# Patient Record
Sex: Male | Born: 1985 | Race: Black or African American | Hispanic: No | Marital: Single | Smoking: Former smoker
Health system: Southern US, Community
[De-identification: ages and names within clinical notes are randomized; demographics above are authoritative.]

## PROBLEM LIST (undated history)

## (undated) DIAGNOSIS — F419 Anxiety disorder, unspecified: Secondary | ICD-10-CM

## (undated) DIAGNOSIS — I499 Cardiac arrhythmia, unspecified: Secondary | ICD-10-CM

## (undated) DIAGNOSIS — K219 Gastro-esophageal reflux disease without esophagitis: Secondary | ICD-10-CM

---
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
## 2007-01-22 ENCOUNTER — Emergency Department (HOSPITAL_COMMUNITY): Admission: EM | Admit: 2007-01-22 | Discharge: 2007-01-22 | Payer: Self-pay | Admitting: Emergency Medicine

## 2008-03-22 ENCOUNTER — Emergency Department (HOSPITAL_COMMUNITY): Admission: EM | Admit: 2008-03-22 | Discharge: 2008-03-22 | Payer: Self-pay | Admitting: Emergency Medicine

## 2008-04-26 ENCOUNTER — Emergency Department (HOSPITAL_COMMUNITY): Admission: EM | Admit: 2008-04-26 | Discharge: 2008-04-26 | Payer: Self-pay | Admitting: Family Medicine

## 2008-09-07 ENCOUNTER — Emergency Department (HOSPITAL_COMMUNITY): Admission: EM | Admit: 2008-09-07 | Discharge: 2008-09-07 | Payer: Self-pay | Admitting: Family Medicine

## 2009-03-19 ENCOUNTER — Emergency Department (HOSPITAL_COMMUNITY): Admission: EM | Admit: 2009-03-19 | Discharge: 2009-03-19 | Payer: Self-pay | Admitting: Emergency Medicine

## 2010-11-03 LAB — HEPATIC FUNCTION PANEL
ALT: 19 U/L (ref 0–53)
AST: 39 U/L — ABNORMAL HIGH (ref 0–37)
Albumin: 4.6 g/dL (ref 3.5–5.2)
Alkaline Phosphatase: 69 U/L (ref 39–117)
Bilirubin, Direct: 0.3 mg/dL (ref 0.0–0.3)
Indirect Bilirubin: 0.7 mg/dL (ref 0.3–0.9)
Total Bilirubin: 1 mg/dL (ref 0.3–1.2)
Total Protein: 7.8 g/dL (ref 6.0–8.3)

## 2010-11-03 LAB — URINE MICROSCOPIC-ADD ON

## 2010-11-03 LAB — URINALYSIS, ROUTINE W REFLEX MICROSCOPIC
Glucose, UA: NEGATIVE mg/dL
Hgb urine dipstick: NEGATIVE
Ketones, ur: 15 mg/dL — AB
Leukocytes, UA: NEGATIVE
Nitrite: NEGATIVE
Protein, ur: 30 mg/dL — AB
Specific Gravity, Urine: 1.041 — ABNORMAL HIGH (ref 1.005–1.030)
Urobilinogen, UA: 1 mg/dL (ref 0.0–1.0)
pH: 6 (ref 5.0–8.0)

## 2010-11-03 LAB — CBC
HCT: 42.7 % (ref 39.0–52.0)
Hemoglobin: 14.7 g/dL (ref 13.0–17.0)
MCHC: 34.5 g/dL (ref 30.0–36.0)
MCV: 93 fL (ref 78.0–100.0)
Platelets: 78 10*3/uL — ABNORMAL LOW (ref 150–400)
RBC: 4.59 MIL/uL (ref 4.22–5.81)
RDW: 14.4 % (ref 11.5–15.5)
WBC: 7 10*3/uL (ref 4.0–10.5)

## 2010-11-03 LAB — POCT I-STAT, CHEM 8
BUN: 14 mg/dL (ref 6–23)
Calcium, Ion: 1.06 mmol/L — ABNORMAL LOW (ref 1.12–1.32)
Chloride: 106 mEq/L (ref 96–112)
Creatinine, Ser: 1.2 mg/dL (ref 0.4–1.5)
Glucose, Bld: 113 mg/dL — ABNORMAL HIGH (ref 70–99)
HCT: 47 % (ref 39.0–52.0)
Hemoglobin: 16 g/dL (ref 13.0–17.0)
Potassium: 3.4 mEq/L — ABNORMAL LOW (ref 3.5–5.1)
Sodium: 140 mEq/L (ref 135–145)
TCO2: 22 mmol/L (ref 0–100)

## 2010-11-03 LAB — DIFFERENTIAL
Basophils Absolute: 0.1 10*3/uL (ref 0.0–0.1)
Basophils Relative: 1 % (ref 0–1)
Eosinophils Absolute: 0 10*3/uL (ref 0.0–0.7)
Eosinophils Relative: 0 % (ref 0–5)
Lymphocytes Relative: 33 % (ref 12–46)
Lymphs Abs: 2.3 10*3/uL (ref 0.7–4.0)
Monocytes Absolute: 0.7 10*3/uL (ref 0.1–1.0)
Monocytes Relative: 10 % (ref 3–12)
Neutro Abs: 3.9 10*3/uL (ref 1.7–7.7)
Neutrophils Relative %: 56 % (ref 43–77)

## 2010-11-03 LAB — URINE CULTURE
Colony Count: NO GROWTH
Culture: NO GROWTH

## 2010-12-11 NOTE — Assessment & Plan Note (Signed)
[facility]                            CARDIOLOGY OFFICE NOTE   NAME:Brian Anderson                        MRN:          [id number]  DATE:12/31/2006                            DOB:          1985/08/07    CARDIOLOGY CONSULTATION   Brian Anderson is 25 years old.  He has had some chest discomfort.  He has  been seen previously in the emergency room at [facility].  Plans had  been made for him to have a cardiology evaluation.  He did not keep his  appointment at our office back in March.  On October 01, 2006, the patient  was seen in the ER with chest pain.  Hemoglobin was 14.  BUN was 9 and  creatinine 0.9, and troponins were normal.  The patient was allowed to  go home.  He was then seen back in the emergency room on Dec 04, 2006.  It was noted that, from his labs in May of 2005, that his chest x-ray  and T spine films had been normal.  EKG has shown some PACs over time.  He does admit that the pain has made him panicky at times.  During the  evaluation on Dec 04, 2006, he was stable.  It was felt that he had PACs  and that he could follow up with [facility] and with me as an  outpatient, and he is here today for further assessment.   The patient's pain is in his left upper chest.  It is somewhat  positional.  It also can be related to inspiration.  He does not have it  today.  He does not have shortness of breath, nausea or vomiting, or  diaphoresis.   PAST MEDICAL HISTORY:   ALLERGIES:  He develops stomach problems with ASPIRIN.   MEDICATIONS:  He is on no medicines at this time.   OTHER MEDICAL PROBLEMS:  See the list below.   SOCIAL HISTORY:  The patient is working part time at this point.  He  looks to go to school further.  He does not smoke at this time.  He does  not drink.   FAMILY HISTORY:  There is no strong family history of coronary disease.   REVIEW OF SYSTEMS:  He has had some palpitations over time and some  headaches.  He has  also had some mild dizziness.  He has been bothered  by some anxiety and depression historically.  Otherwise, his review of  systems is negative.   PHYSICAL EXAM:  Blood pressure is 124/70 with a pulse of 62.  The patient is oriented to person, time, and place.  His affect is  normal.  His weight is 187 pounds.  There is no xanthelasma.  He has normal extraocular motions.  There are no carotid bruits.  There is no jugular venous distension.  LUNGS:  Clear.  RESPIRATORY:  Not labored.  CARDIAC:  Reveals an S1 with an S2.  There are no clicks or significant  murmurs.  ABDOMEN:  Soft.  He has normal bowel sounds.  He has no  peripheral edema.   EKG:  Reveals benign early repolarization.   IMPRESSION:  1. Chest discomfort.  This appears to have a musculoskeletal component      to it.  We know that his chest x-ray and spine films are reported      as normal through the emergency room note.  I doubt that the      patient has significant cardiac ischemia.  I do feel it would be      helpful to do a standard exercise tolerance test, and this will be      arranged.  2. History of some anxiety and depression.  3. Some gastrointestinal symptoms from ASPIRIN in the past.     Luis Abed, MD, [facility]  Electronically Signed    JDK/MedQ  DD: 12/31/2006  DT: 12/31/2006  Job #: [id number]   cc:   [facility]

## 2011-05-15 LAB — H. PYLORI ANTIBODY, IGG: H Pylori IgG: 5.3 — ABNORMAL HIGH

## 2011-05-15 LAB — POCT URINALYSIS DIP (DEVICE)
Bilirubin Urine: NEGATIVE
Glucose, UA: NEGATIVE
Hgb urine dipstick: NEGATIVE
Nitrite: NEGATIVE
Operator id: 247071
Specific Gravity, Urine: 1.02
Urobilinogen, UA: 1
pH: 6.5

## 2011-07-30 HISTORY — PX: OTHER SURGICAL HISTORY: SHX169

## 2013-07-05 ENCOUNTER — Encounter (HOSPITAL_COMMUNITY): Payer: Self-pay | Admitting: Emergency Medicine

## 2013-07-05 ENCOUNTER — Emergency Department (INDEPENDENT_AMBULATORY_CARE_PROVIDER_SITE_OTHER)
Admission: EM | Admit: 2013-07-05 | Discharge: 2013-07-05 | Disposition: A | Discharge status: Home / Self Care | Payer: Worker's Compensation | Source: Home / Self Care | Attending: Family Medicine | Admitting: Family Medicine

## 2013-07-05 DIAGNOSIS — M65839 Other synovitis and tenosynovitis, unspecified forearm: Secondary | ICD-10-CM

## 2013-07-05 DIAGNOSIS — M778 Other enthesopathies, not elsewhere classified: Secondary | ICD-10-CM

## 2013-07-05 NOTE — ED Notes (Signed)
reportedly has been having pain both wrists, forearms for past few weeks; no known injury; hands go numb, looses grip w hands

## 2013-07-05 NOTE — ED Provider Notes (Signed)
CSN: [id number]     Arrival date & time 07/05/13  1640 History   None    Chief Complaint  Patient presents with  . Wrist Pain   (Consider location/radiation/quality/duration/timing/severity/associated sxs/prior Treatment) HPI Comments: 89m with right wrist pain for 3 weeks, started mild, now worse and constant.  Has had some subjective numbness in hand, intermittent, resolves quickly.  Had some swelling, this has mostly resolved.  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  No injury.  Pain worse throughout the day and worse at night.  No issues with left wrist.  No Hx of similar pains    History reviewed. No pertinent past medical history. History reviewed. No pertinent past surgical history. History reviewed. No pertinent family history. History  Substance Use Topics  . Smoking status: Current Every Day Smoker  . Smokeless tobacco: Not on file  . Alcohol Use: Yes    Review of Systems  Constitutional: Negative for fever, chills and fatigue.  HENT: Negative for sore throat.   Eyes: Negative for visual disturbance.  Respiratory: Negative for cough and shortness of breath.   Cardiovascular: Negative for chest pain, palpitations and leg swelling.  Gastrointestinal: Negative for nausea, vomiting, abdominal pain, diarrhea and constipation.  Genitourinary: Negative for dysuria, urgency, frequency and hematuria.  Musculoskeletal: Positive for arthralgias (right wrist pain). Negative for myalgias, neck pain and neck stiffness.  Skin: Negative for rash.  Neurological: Negative for dizziness, weakness and light-headedness.    Allergies  Review of patient's allergies indicates not on file.  Home Medications  No current outpatient prescriptions on file. BP 136/72  Pulse 68  Temp(Src) 97.8 F (36.6 C) (Oral)  Resp 18  SpO2 98% Physical Exam  Nursing note and vitals reviewed. Constitutional: He is oriented to person,  place, and time. He appears well-developed and well-nourished. No distress.  HENT:  Head: Normocephalic.  Pulmonary/Chest: Effort normal. No respiratory distress.  Musculoskeletal:       Right wrist: He exhibits decreased range of motion (decreased flexion and extension, splinting secondary to pain in wrist; decreased grip strenth as well), tenderness (dorsal and volar wrist) and swelling (mild). He exhibits no bony tenderness, no effusion, no crepitus and no deformity.  Neurological: He is alert and oriented to person, place, and time. No sensory deficit. Coordination normal.  Skin: Skin is warm and dry. No rash noted. He is not diaphoretic.  Psychiatric: He has a normal mood and affect. Judgment normal.    ED Course  Procedures (including critical care time) Labs Review Labs Reviewed - No data to display Imaging Review No results found.    MDM   1. Tendinitis of right wrist    Consistent with tendinitis.  Splint, 2 aleve twice daily (has at home), ice preferably 3-4 times daily but at least twice.  Use splint at work and while sleeping.  Dont do things that cause pain in wrist.  Referral info given for Dr. Eulah Pont, ortho, if not improving.         Graylon Good, PA-C 07/05/13 1759

## 2013-07-05 NOTE — ED Provider Notes (Signed)
Medical screening examination/treatment/procedure(s) were performed by resident physician or non-physician practitioner and as supervising physician I was immediately available for consultation/collaboration.   Barkley Bruns MD.   Linna Hoff, MD 07/05/13 2038

## 2013-09-09 ENCOUNTER — Emergency Department (HOSPITAL_COMMUNITY): Payer: BC Managed Care – PPO

## 2013-09-09 ENCOUNTER — Emergency Department (HOSPITAL_COMMUNITY)
Admission: EM | Admit: 2013-09-09 | Discharge: 2013-09-10 | Disposition: A | Discharge status: Home / Self Care | Payer: BC Managed Care – PPO | Attending: Emergency Medicine | Admitting: Emergency Medicine

## 2013-09-09 DIAGNOSIS — Y9389 Activity, other specified: Secondary | ICD-10-CM | POA: Insufficient documentation

## 2013-09-09 DIAGNOSIS — S62639A Displaced fracture of distal phalanx of unspecified finger, initial encounter for closed fracture: Secondary | ICD-10-CM | POA: Insufficient documentation

## 2013-09-09 DIAGNOSIS — Y92009 Unspecified place in unspecified non-institutional (private) residence as the place of occurrence of the external cause: Secondary | ICD-10-CM | POA: Insufficient documentation
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
## 2013-09-10 MED ORDER — HYDROCODONE-ACETAMINOPHEN 5-325 MG PO TABS: 1.0000 | ORAL_TABLET | ORAL | Status: DC | PRN

## 2013-09-10 NOTE — Discharge Instructions (Signed)
Followup with [facility] or Dr. Eulah PontMurphy soon as possible. Take Vicodin for severe pain only. No driving or operating heavy machinery while taking vicodin. This medication may cause drowsiness.  Finger Fracture Fractures of fingers are breaks in the bones of the fingers. There are many types of fractures. There are different ways of treating these fractures. Your health care provider will discuss the best way to treat your fracture. CAUSES Traumatic injury is the main cause of broken fingers. These include:  Injuries while playing sports.  Workplace injuries.  Falls. RISK FACTORS Activities that can increase your risk of finger fractures include:  Sports.  Workplace activities that involve machinery.  A condition called osteoporosis, which can make your bones less dense and cause them to fracture more easily. SIGNS AND SYMPTOMS The main symptoms of a broken finger are pain and swelling within 15 minutes after the injury. Other symptoms include:  Bruising of your finger.  Stiffness of your finger.  Numbness of your finger.  Exposed bones (compound fracture) if the fracture is severe. DIAGNOSIS  The best way to diagnose a broken bone is with X-ray imaging. Additionally, your health care provider will use this X-ray image to evaluate the position of the broken finger bones.  TREATMENT  Finger fractures can be treated with:   Nonreduction This means the bones are in place. The finger is splinted without changing the positions of the bone pieces. The splint is usually left on for about a week to 10 days. This will depend on your fracture and what your health care provider thinks.  Closed reduction The bones are put back into position without using surgery. The finger is then splinted.  Open reduction and internal fixation The fracture site is opened. Then the bone pieces are fixed into place with pins or some type of hardware. This is seldom required. It depends on the  severity of the fracture. HOME CARE INSTRUCTIONS   Follow your health care provider's instructions regarding activities, exercises, and physical therapy.  Only take over-the-counter or prescription medicines for pain, discomfort, or fever as directed by your health care provider. SEEK MEDICAL CARE IF: You have pain or swelling that limits the motion or use of your fingers. SEEK IMMEDIATE MEDICAL CARE IF:  Your finger becomes numb. MAKE SURE YOU:   Understand these instructions.  Will watch your condition.  Will get help right away if you are not doing well or get worse. Document Released: 10/27/2000 Document Revised: 05/05/2013 Document Reviewed: 02/24/2013 [facility] Patient Information 2014 [employer] [place], [place].

## 2013-09-11 NOTE — ED Provider Notes (Signed)
Medical screening examination/treatment/procedure(s) were performed by non-physician practitioner and as supervising physician I was immediately available for consultation/collaboration.  EKG Interpretation   None        Derwood KaplanAnkit Conner Muegge, MD 09/11/13 [phone]

## 2013-09-21 ENCOUNTER — Emergency Department (INDEPENDENT_AMBULATORY_CARE_PROVIDER_SITE_OTHER)
Admission: EM | Admit: 2013-09-21 | Discharge: 2013-09-21 | Disposition: A | Discharge status: Home / Self Care | Payer: BC Managed Care – PPO | Source: Home / Self Care

## 2013-09-21 ENCOUNTER — Encounter (HOSPITAL_COMMUNITY): Payer: Self-pay | Admitting: Emergency Medicine

## 2013-09-21 DIAGNOSIS — R42 Dizziness and giddiness: Secondary | ICD-10-CM

## 2013-09-21 MED ORDER — MECLIZINE HCL 25 MG PO TABS: 25.0000 mg | ORAL_TABLET | Freq: Three times a day (TID) | ORAL | Status: DC | PRN

## 2013-09-21 NOTE — ED Notes (Signed)
Reports having dizziness since 10 a.m.  States it usually comes and goes but states this time dizziness has been constant.    Mild nausea.   Denies any other symptoms.

## 2013-09-21 NOTE — ED Provider Notes (Signed)
CSN: [id number]     Arrival date & time 09/21/13  1517 History   First MD Initiated Contact with Patient 09/21/13 1555     Chief Complaint  Patient presents with  . Dizziness     (Consider location/radiation/quality/duration/timing/severity/associated sxs/prior Treatment) HPI Comments: 28 year old male is complaining of dizziness that began this morning it 10:00. Occasionally has dizziness at work and usually subsides spontaneously. He is concerned that this occurred while he was not working and is lasted the remainder of the stay. It is worse when standing and better when lying down. He was able to sleep a couple hours this afternoon and felt generally well. Sometimes body position changes and movement of the head will cause dizziness. Denies vertigo, nausea or vomiting. Denies problems with vision, speech, hearing, swallowing, focal weakness or paresthesias. No incontinence. No headache. No history of trauma.   History reviewed. No pertinent past medical history. History reviewed. No pertinent past surgical history. History reviewed. No pertinent family history. History  Substance Use Topics  . Smoking status: Current Every Day Smoker  . Smokeless tobacco: Not on file  . Alcohol Use: Yes    Review of Systems  Constitutional: Negative.   HENT: Negative.   Eyes: Negative.   Respiratory: Negative.   Cardiovascular: Negative.   Gastrointestinal: Negative.   Genitourinary: Negative.   Musculoskeletal: Negative.   Skin: Negative.   Neurological: Positive for dizziness. Negative for tremors, seizures, syncope, facial asymmetry, speech difficulty, weakness, numbness and headaches.  Psychiatric/Behavioral: Negative for behavioral problems, confusion, sleep disturbance and agitation. The patient is not nervous/anxious.       Allergies  Review of patient's allergies indicates no known allergies.  Home Medications   Current Outpatient Rx  Name  Route  Sig  Dispense  Refill  .  HYDROcodone-acetaminophen (NORCO/VICODIN) 5-325 MG per tablet   Oral   Take 1-2 tablets by mouth every 4 (four) hours as needed.   10 tablet   0   . meclizine (ANTIVERT) 25 MG tablet   Oral   Take 1 tablet (25 mg total) by mouth 3 (three) times daily as needed for dizziness.   15 tablet   0    BP 144/98  Pulse 67  Temp(Src) 98.8 F (37.1 C) (Oral)  Resp 16  SpO2 99% Physical Exam  Nursing note and vitals reviewed. Constitutional: He is oriented to person, place, and time. He appears well-developed and well-nourished. No distress.  HENT:  Head: Normocephalic and atraumatic.  Mouth/Throat: Oropharynx is clear and moist. No oropharyngeal exudate.  Bilateral TMs are normal  Eyes: Conjunctivae and EOM are normal. Pupils are equal, round, and reactive to light.  Neck: Normal range of motion. Neck supple.  Cardiovascular: Normal rate, regular rhythm and normal heart sounds.   Pulmonary/Chest: Effort normal and breath sounds normal. No respiratory distress. He has no wheezes.  Musculoskeletal: Normal range of motion. He exhibits no edema and no tenderness.  Lymphadenopathy:    He has no cervical adenopathy.  Neurological: He is alert and oriented to person, place, and time. He has normal strength. He displays no tremor. No cranial nerve deficit or sensory deficit. He exhibits normal muscle tone. He displays a negative Romberg sign. He displays no seizure activity. Coordination and gait normal.  Experiences some sway and dizziness with Romberg test. Heel-to-toe is balanced.  Skin: Skin is warm and dry. No rash noted.  Psychiatric: He has a normal mood and affect.    ED Course  Procedures (including critical care time)  Labs Review Labs Reviewed - No data to display Imaging Review No results found.    MDM   Final diagnoses:  Dizziness    Reassurance. Most likely inner ear disorder or side effect of the hydrocodone. The patient is in generally good health and not  demonstrating other symptoms. Meclizine 25 mg take one half to one tablet 3 times a day when necessary. drink plenty fluids and stay well hydrated Worsening problems or need symptoms get rechecked. Return or if necessary get to the emergency department.    Hayden Rasmussenavid Zakhai Meisinger, NP 09/21/13 [phone]

## 2013-09-21 NOTE — Discharge Instructions (Signed)

## 2013-09-22 NOTE — ED Provider Notes (Signed)
Medical screening examination/treatment/procedure(s) were performed by a resident physician or non-physician practitioner and as the supervising physician I was immediately available for consultation/collaboration.  Evan Corey, MD    Evan S Corey, MD 09/22/13 0748 

## 2013-09-30 ENCOUNTER — Encounter (HOSPITAL_COMMUNITY): Payer: Self-pay | Admitting: Pharmacy Technician

## 2013-09-30 ENCOUNTER — Other Ambulatory Visit (HOSPITAL_COMMUNITY): Payer: Self-pay | Admitting: Orthopaedic Surgery

## 2013-10-07 MED ORDER — CEFAZOLIN SODIUM-DEXTROSE 2-3 GM-% IV SOLR
2.0000 g | INTRAVENOUS | Status: AC
  Administered 2013-10-08: 2 g via INTRAVENOUS
  Filled 2013-10-07: qty 50

## 2013-10-08 ENCOUNTER — Ambulatory Visit (HOSPITAL_COMMUNITY)
Admission: RE | Admit: 2013-10-08 | Discharge: 2013-10-08 | Disposition: A | Discharge status: Home / Self Care | Payer: BC Managed Care – PPO | Source: Ambulatory Visit | Attending: Orthopaedic Surgery | Admitting: Orthopaedic Surgery

## 2013-10-08 ENCOUNTER — Ambulatory Visit (HOSPITAL_COMMUNITY): Payer: BC Managed Care – PPO | Admitting: Anesthesiology

## 2013-10-08 ENCOUNTER — Encounter (HOSPITAL_COMMUNITY): Payer: BC Managed Care – PPO | Admitting: Anesthesiology

## 2013-10-08 ENCOUNTER — Encounter (HOSPITAL_COMMUNITY)
Admission: RE | Disposition: A | Discharge status: Home / Self Care | Payer: Self-pay | Source: Ambulatory Visit | Attending: Orthopaedic Surgery

## 2013-10-08 ENCOUNTER — Encounter (HOSPITAL_COMMUNITY): Payer: Self-pay | Admitting: *Deleted

## 2013-10-08 DIAGNOSIS — F411 Generalized anxiety disorder: Secondary | ICD-10-CM | POA: Insufficient documentation

## 2013-10-08 DIAGNOSIS — I499 Cardiac arrhythmia, unspecified: Secondary | ICD-10-CM | POA: Insufficient documentation

## 2013-10-08 DIAGNOSIS — F17200 Nicotine dependence, unspecified, uncomplicated: Secondary | ICD-10-CM | POA: Insufficient documentation

## 2013-10-08 DIAGNOSIS — S62639A Displaced fracture of distal phalanx of unspecified finger, initial encounter for closed fracture: Secondary | ICD-10-CM | POA: Insufficient documentation

## 2013-10-08 DIAGNOSIS — K219 Gastro-esophageal reflux disease without esophagitis: Secondary | ICD-10-CM | POA: Insufficient documentation

## 2013-10-08 DIAGNOSIS — X58XXXA Exposure to other specified factors, initial encounter: Secondary | ICD-10-CM | POA: Insufficient documentation

## 2013-10-08 HISTORY — PX: PERCUTANEOUS PINNING: SHX2209

## 2013-10-08 HISTORY — DX: Anxiety disorder, unspecified: F41.9

## 2013-10-08 HISTORY — DX: Gastro-esophageal reflux disease without esophagitis: K21.9

## 2013-10-08 HISTORY — DX: Cardiac arrhythmia, unspecified: I49.9

## 2013-10-08 LAB — BASIC METABOLIC PANEL
BUN: 9 mg/dL (ref 6–23)
CALCIUM: 10 mg/dL (ref 8.4–10.5)
CO2: 27 meq/L (ref 19–32)
Chloride: 99 mEq/L (ref 96–112)
Creatinine, Ser: 0.99 mg/dL (ref 0.50–1.35)
GFR calc Af Amer: >90 mL/min (ref >90)
Glucose, Bld: 89 mg/dL (ref 70–99)
POTASSIUM: 4.3 meq/L (ref 3.7–5.3)
Sodium: 140 mEq/L (ref 137–147)

## 2013-10-08 LAB — CBC
HCT: 43.9 % (ref 39.0–52.0)
HEMOGLOBIN: 14.9 g/dL (ref 13.0–17.0)
MCH: 29.6 pg (ref 26.0–34.0)
MCHC: 33.9 g/dL (ref 30.0–36.0)
MCV: 87.3 fL (ref 78.0–100.0)
Platelets: 153 10*3/uL (ref 150–400)
RBC: 5.03 MIL/uL (ref 4.22–5.81)
RDW: 13.6 % (ref 11.5–15.5)
WBC: 7.4 10*3/uL (ref 4.0–10.5)

## 2013-10-08 SURGERY — PINNING, EXTREMITY, PERCUTANEOUS
Anesthesia: General | Site: Hand | Laterality: Left

## 2013-10-08 MED ORDER — 0.9 % SODIUM CHLORIDE (POUR BTL) OPTIME
TOPICAL | Status: DC | PRN
  Administered 2013-10-08: 1000 mL

## 2013-10-08 MED ORDER — ONDANSETRON HCL 4 MG/2ML IJ SOLN: 4.0000 mg | Freq: Once | INTRAMUSCULAR | Status: DC | PRN

## 2013-10-08 MED ORDER — HYDROMORPHONE HCL PF 1 MG/ML IJ SOLN
0.2500 mg | INTRAMUSCULAR | Status: DC | PRN
  Administered 2013-10-08 (×3): 0.5 mg via INTRAVENOUS

## 2013-10-08 MED ORDER — SUCCINYLCHOLINE CHLORIDE 20 MG/ML IJ SOLN
INTRAMUSCULAR | Status: AC
  Filled 2013-10-08: qty 1

## 2013-10-08 MED ORDER — MIDAZOLAM HCL 2 MG/2ML IJ SOLN
INTRAMUSCULAR | Status: AC
  Filled 2013-10-08: qty 2

## 2013-10-08 MED ORDER — PROPOFOL 10 MG/ML IV BOLUS
INTRAVENOUS | Status: AC
  Filled 2013-10-08: qty 20

## 2013-10-08 MED ORDER — HYDROMORPHONE HCL PF 1 MG/ML IJ SOLN
INTRAMUSCULAR | Status: AC
  Administered 2013-10-08: 0.5 mg via INTRAVENOUS
  Filled 2013-10-08: qty 1

## 2013-10-08 MED ORDER — LACTATED RINGERS IV SOLN
INTRAVENOUS | Status: DC
  Administered 2013-10-08: 13:00:00 via INTRAVENOUS

## 2013-10-08 MED ORDER — ONDANSETRON HCL 4 MG/2ML IJ SOLN
INTRAMUSCULAR | Status: DC | PRN
  Administered 2013-10-08: 4 mg via INTRAVENOUS

## 2013-10-08 MED ORDER — LIDOCAINE HCL (CARDIAC) 20 MG/ML IV SOLN
INTRAVENOUS | Status: AC
  Filled 2013-10-08: qty 5

## 2013-10-08 MED ORDER — HYDROCODONE-ACETAMINOPHEN 5-325 MG PO TABS
1.0000 | ORAL_TABLET | Freq: Once | ORAL | Status: AC
  Administered 2013-10-08: 2 via ORAL

## 2013-10-08 MED ORDER — OXYCODONE HCL 5 MG/5ML PO SOLN: 5.0000 mg | Freq: Once | ORAL | Status: DC | PRN

## 2013-10-08 MED ORDER — HYDROCODONE-ACETAMINOPHEN 5-325 MG PO TABS
ORAL_TABLET | ORAL | Status: AC
  Filled 2013-10-08: qty 2

## 2013-10-08 MED ORDER — FENTANYL CITRATE 0.05 MG/ML IJ SOLN
INTRAMUSCULAR | Status: DC | PRN
  Administered 2013-10-08: 50 ug via INTRAVENOUS
  Administered 2013-10-08 (×4): 25 ug via INTRAVENOUS
  Administered 2013-10-08: 50 ug via INTRAVENOUS

## 2013-10-08 MED ORDER — HYDROCODONE-ACETAMINOPHEN 5-325 MG PO TABS: 1.0000 | ORAL_TABLET | Freq: Four times a day (QID) | ORAL | Status: DC | PRN

## 2013-10-08 MED ORDER — FENTANYL CITRATE 0.05 MG/ML IJ SOLN
INTRAMUSCULAR | Status: AC
  Filled 2013-10-08: qty 5

## 2013-10-08 MED ORDER — LIDOCAINE HCL (CARDIAC) 10 MG/ML IV SOLN
INTRAVENOUS | Status: DC | PRN
  Administered 2013-10-08: 50 mg via INTRAVENOUS

## 2013-10-08 MED ORDER — OXYCODONE HCL 5 MG PO TABS: 5.0000 mg | ORAL_TABLET | Freq: Once | ORAL | Status: DC | PRN

## 2013-10-08 MED ORDER — PROPOFOL 10 MG/ML IV BOLUS
INTRAVENOUS | Status: DC | PRN
  Administered 2013-10-08: 200 mg via INTRAVENOUS

## 2013-10-08 MED ORDER — MIDAZOLAM HCL 5 MG/5ML IJ SOLN
INTRAMUSCULAR | Status: DC | PRN
  Administered 2013-10-08: 2 mg via INTRAVENOUS

## 2013-10-08 SURGICAL SUPPLY — 38 items
APL SKNCLS STERI-STRIP NONHPOA (GAUZE/BANDAGES/DRESSINGS)
BANDAGE COBAN STERILE 2 (GAUZE/BANDAGES/DRESSINGS) ×2 IMPLANT
BANDAGE CONFORM 2  STR LF (GAUZE/BANDAGES/DRESSINGS) ×2 IMPLANT
BANDAGE ELASTIC 3 VELCRO ST LF (GAUZE/BANDAGES/DRESSINGS) ×3 IMPLANT
BANDAGE ELASTIC 4 VELCRO ST LF (GAUZE/BANDAGES/DRESSINGS) IMPLANT
BANDAGE GAUZE ELAST BULKY 4 IN (GAUZE/BANDAGES/DRESSINGS) ×3 IMPLANT
BENZOIN TINCTURE PRP APPL 2/3 (GAUZE/BANDAGES/DRESSINGS) IMPLANT
BLADE SURG ROTATE 9660 (MISCELLANEOUS) IMPLANT
CLOSURE WOUND 1/2 X4 (GAUZE/BANDAGES/DRESSINGS)
COVER SURGICAL LIGHT HANDLE (MISCELLANEOUS) ×3 IMPLANT
CUFF TOURNIQUET SINGLE 18IN (TOURNIQUET CUFF) IMPLANT
CUFF TOURNIQUET SINGLE 24IN (TOURNIQUET CUFF) IMPLANT
DURAPREP 26ML APPLICATOR (WOUND CARE) ×3 IMPLANT
ELECT CAUTERY BLADE 6.4 (BLADE) ×3 IMPLANT
FACESHIELD LNG OPTICON STERILE (SAFETY) IMPLANT
GAUZE XEROFORM 1X8 LF (GAUZE/BANDAGES/DRESSINGS) ×2 IMPLANT
GLOVE SS BIOGEL STRL SZ 7.5 (GLOVE) ×2 IMPLANT
GLOVE SUPERSENSE BIOGEL SZ 7.5 (GLOVE) ×4
GOWN STRL REUS W/ TWL LRG LVL3 (GOWN DISPOSABLE) ×2 IMPLANT
GOWN STRL REUS W/ TWL XL LVL3 (GOWN DISPOSABLE) ×2 IMPLANT
GOWN STRL REUS W/TWL LRG LVL3 (GOWN DISPOSABLE) ×6
GOWN STRL REUS W/TWL XL LVL3 (GOWN DISPOSABLE) ×6
K-WIRE SMTH SNGL TROCAR .035X9 ×6 IMPLANT
KIT BASIN OR (CUSTOM PROCEDURE TRAY) ×3 IMPLANT
KIT ROOM TURNOVER OR (KITS) ×3 IMPLANT
KWIRE SMTH SNGL TROCAR .035X9 IMPLANT
NS IRRIG 1000ML POUR BTL (IV SOLUTION) ×3 IMPLANT
PACK ORTHO EXTREMITY (CUSTOM PROCEDURE TRAY) ×3 IMPLANT
PAD ARMBOARD 7.5X6 YLW CONV (MISCELLANEOUS) ×6 IMPLANT
SPLINT FINGER (SOFTGOODS) ×2 IMPLANT
SPONGE GAUZE 4X4 12PLY (GAUZE/BANDAGES/DRESSINGS) IMPLANT
STRIP CLOSURE SKIN 1/2X4 (GAUZE/BANDAGES/DRESSINGS) IMPLANT
SUT ETHILON 4 0 P 3 18 (SUTURE) IMPLANT
SUT PROLENE 4 0 P 3 18 (SUTURE) IMPLANT
TOWEL OR 17X24 6PK STRL BLUE (TOWEL DISPOSABLE) ×3 IMPLANT
TOWEL OR 17X26 10 PK STRL BLUE (TOWEL DISPOSABLE) ×3 IMPLANT
UNDERPAD 30X30 INCONTINENT (UNDERPADS AND DIAPERS) ×3 IMPLANT
WATER STERILE IRR 1000ML POUR (IV SOLUTION) ×3 IMPLANT

## 2013-10-08 NOTE — H&P (Signed)
PREOPERATIVE H&P  Chief Complaint: LEFT DISTAL PHALANX FRACTURE 5TH FINGER   HPI: Brian Anderson is a 28 y.o. male who presents for surgical treatment of LEFT DISTAL PHALANX FRACTURE 5TH FINGER .  He denies any changes in medical history.  Past Medical History  Diagnosis Date  . GERD (gastroesophageal reflux disease)   . Anxiety   . Dysrhythmia     pt reports irreg heart beat "PACS" per pt    Past Surgical History  Procedure Laterality Date  . Wisdom teeth  2013   History   Social History  . Marital Status: Single    Spouse Name: N/A    Number of Children: N/A  . Years of Education: N/A   Social History Main Topics  . Smoking status: Current Every Day Smoker -- 1.00 packs/day for 4 years    Types: Cigars  . Smokeless tobacco: None  . Alcohol Use: 5.4 oz/week    3 Cans of beer, 6 Shots of liquor per week  . Drug Use: No  . Sexual Activity: Yes   Other Topics Concern  . None   Social History Narrative  . None   History reviewed. No pertinent family history. No Known Allergies Prior to Admission medications   Medication Sig Start Date End Date Taking? Authorizing Provider  HYDROcodone-acetaminophen (NORCO/VICODIN) 5-325 MG per tablet Take 1-2 tablets by mouth every 4 (four) hours as needed for moderate pain.   Yes Historical Provider, MD  meclizine (ANTIVERT) 25 MG tablet Take 1 tablet (25 mg total) by mouth 3 (three) times daily as needed for dizziness. 09/21/13  Yes Hayden Rasmussenavid Mabe, NP     Positive ROS: All other systems have been reviewed and were otherwise negative with the exception of those mentioned in the HPI and as above.  Physical Exam: General: Alert, no acute distress Cardiovascular: No pedal edema Respiratory: No cyanosis, no use of accessory musculature GI: No organomegaly, abdomen is soft and non-tender Skin: No lesions in the area of chief complaint Neurologic: Sensation intact distally Psychiatric: Patient is competent for consent with normal mood and  affect Lymphatic: No axillary or cervical lymphadenopathy  MUSCULOSKELETAL:  - no skin issues in area of planned surgery  Assessment: LEFT DISTAL PHALANX FRACTURE 5TH FINGER   Plan: Plan for Procedure(s): LEFT 5TH FINGER CLOSED REDUCTION PERCUTANEOUS PINNING, POSSIBLE OPEN  The risks benefits and alternatives were discussed with the patient including but not limited to the risks of nonoperative treatment, versus surgical intervention including infection, bleeding, nerve injury,  blood clots, cardiopulmonary complications, morbidity, mortality, among others, and they were willing to proceed.   Cheral AlmasXu, Naiping Michael, MD   10/08/2013 1:06 PM '

## 2013-10-08 NOTE — Transfer of Care (Signed)
Immediate Anesthesia Transfer of Care Note  Patient: Brian MoralesBilly D Thies  Procedure(s) Performed: Procedure(s): LEFT 5TH FINGER CLOSED REDUCTION PERCUTANEOUS PINNING (Left)  Patient Location: PACU  Anesthesia Type:General  Level of Consciousness: awake, alert  and oriented  Airway & Oxygen Therapy: Patient Spontanous Breathing and Patient connected to nasal cannula oxygen  Post-op Assessment: Report given to PACU RN, Post -op Vital signs reviewed and stable and Patient moving all extremities X 4  Post vital signs: Reviewed and stable  Complications: No apparent anesthesia complications

## 2013-10-08 NOTE — Progress Notes (Signed)
10/08/13 1238  OBSTRUCTIVE SLEEP APNEA  Have you ever been diagnosed with sleep apnea through a sleep study? No  Do you snore loudly (loud enough to be heard through closed doors)?  1  Do you often feel tired, fatigued, or sleepy during the daytime? 1  Has anyone observed you stop breathing during your sleep? 0  Do you have, or are you being treated for high blood pressure? 1  BMI more than 35 kg/m2? 0  Age over 26 years old? 0  Neck circumference greater than 40 cm/18 inches? 0  Gender: 1  Obstructive Sleep Apnea Score 4  Score 4 or greater  Results sent to PCP

## 2013-10-08 NOTE — Op Note (Signed)
Date of surgery: 10/08/2000  Preoperative diagnosis: Failed closed treatment of distal phalanx fracture of left fifth finger  Postoperative diagnosis: Same  Procedure: Closed reduction and percutaneous pinning of distal phalanx fracture of left fifth finger  Surgeon: Glee ArvinMichael Easter Schinke, M.D. next  Anesthesia: Gen.  Estimated blood loss: Minimal  Complications: None  Indications for procedure: Brian Anderson is a 28 year old gentleman who sustained a distal phalanx fracture we attempted closed treatment with by splinting. Splinting revealed to be inadequate for maintain reduction of the phalangeal fracture. The the recommendation at that time was to perform the above mentioned procedure. The risks, benefits, and alternatives to surgery were discussed with the patient. The risks included but not limited to infection nonunion continued pain additional surgeries and anesthesia risks including heart attack stroke and death.  Description of procedure: The patient was identified in preoperative holding area. The operative site was confirmed the patient and marked by the surgeon. He was brought back to the operating room. His placed supine on the table. General anesthesia was induced by the anesthesiologist. A nonsterile tourniquet was placed on the upper left arm. The left upper extremity was prepped and draped in standard sterile fashion. A timeout was performed. Preoperative antibiotics were given. Closed reduction was performed on the distal phalanx. Adequate alignment and reduction of the fracture was confirmed on x-rays. One 0.035 inch K wire was advanced through the distal phalanx across the fracture site into the base of the distal phalanx and into the middle phalanx. The decision to capture the middle phalanx was made given the fact that the proximal portion of the distal phalanx was such a small piece that the pin was not able to get much purchase and I was only able to use one pin given the size of the  distal phalanx. Once this was done x-rays were taken to confirm adequate reduction and pin placement a sterile dressing was applied the finger was placed in a AlumaFoam splint. The patient awoke from anesthesia uneventfully and was transferred to the PACU in stable condition.  Disposition: The patient will be nonweightbearing to the left hand. He will keep the dressing on until his followup appointment in 2 weeks.  Mayra ReelN. Michael Kia Stavros, MD [facility] [phone] 2:57 PM

## 2013-10-08 NOTE — Anesthesia Procedure Notes (Signed)
Procedure Name: LMA Insertion Date/Time: 10/08/2013 2:16 PM Performed by: Elon AlasLEE, Aby Gessel BROWN Pre-anesthesia Checklist: Patient identified, Timeout performed, Emergency Drugs available, Suction available and Patient being monitored Patient Re-evaluated:Patient Re-evaluated prior to inductionOxygen Delivery Method: Circle system utilized Preoxygenation: Pre-oxygenation with 100% oxygen Intubation Type: IV induction Ventilation: Mask ventilation without difficulty LMA: LMA inserted LMA Size: 5.0 Number of attempts: 1 Placement Confirmation: positive ETCO2,  CO2 detector and breath sounds checked- equal and bilateral Tube secured with: Tape Dental Injury: Teeth and Oropharynx as per pre-operative assessment

## 2013-10-08 NOTE — Anesthesia Preprocedure Evaluation (Addendum)
Anesthesia Evaluation  Patient identified by MRN, date of birth, ID band Patient awake    Reviewed: Allergy & Precautions, H&P , NPO status , Patient's Chart, lab work & pertinent test results, reviewed documented beta blocker date and time   Airway Mallampati: II      Dental  (+) Teeth Intact, Dental Advisory Given   Pulmonary Current Smoker,  breath sounds clear to auscultation        Cardiovascular Rhythm:Regular Rate:Normal     Neuro/Psych    GI/Hepatic   Endo/Other    Renal/GU      Musculoskeletal   Abdominal (+) + obese,   Peds  Hematology   Anesthesia Other Findings   Reproductive/Obstetrics                          Anesthesia Physical Anesthesia Plan  ASA: II  Anesthesia Plan: General   Post-op Pain Management:    Induction: Intravenous  Airway Management Planned: LMA  Additional Equipment:   Intra-op Plan:   Post-operative Plan:   Informed Consent: I have reviewed the patients History and Physical, chart, labs and discussed the procedure including the risks, benefits and alternatives for the proposed anesthesia with the patient or authorized representative who has indicated his/her understanding and acceptance.   Dental advisory given  Plan Discussed with: CRNA and Anesthesiologist  Anesthesia Plan Comments: (L. Finger Fracture with non-union Smoker  Plan GA with LMA  Kipp Broodavid Suheyb Raucci, MD )        Anesthesia Quick Evaluation

## 2013-10-08 NOTE — Discharge Instructions (Signed)
Keep splint on until follow up appointment.

## 2013-10-08 NOTE — Anesthesia Postprocedure Evaluation (Signed)
  Anesthesia Post-op Note  Patient: Brian Anderson  Procedure(s) Performed: Procedure(s): LEFT 5TH FINGER CLOSED REDUCTION PERCUTANEOUS PINNING (Left)  Patient Location: PACU  Anesthesia Type:General  Level of Consciousness: awake, alert  and oriented  Airway and Oxygen Therapy: Patient Spontanous Breathing and Patient connected to nasal cannula oxygen  Post-op Pain: mild  Post-op Assessment: Post-op Vital signs reviewed, Patient's Cardiovascular Status Stable, Respiratory Function Stable, Patent Airway and Pain level controlled  Post-op Vital Signs: stable  Complications: No apparent anesthesia complications

## 2013-10-08 NOTE — Preoperative (Signed)
Beta Blockers   Reason not to administer Beta Blockers:Not Applicable 

## 2013-10-12 ENCOUNTER — Encounter (HOSPITAL_COMMUNITY): Payer: Self-pay | Admitting: Orthopaedic Surgery

## 2013-11-13 ENCOUNTER — Emergency Department (HOSPITAL_COMMUNITY)
Admission: EM | Admit: 2013-11-13 | Discharge: 2013-11-13 | Disposition: A | Discharge status: Home / Self Care | Payer: BC Managed Care – PPO | Attending: Emergency Medicine | Admitting: Emergency Medicine

## 2013-11-13 ENCOUNTER — Encounter (HOSPITAL_COMMUNITY): Payer: Self-pay | Admitting: Emergency Medicine

## 2013-11-13 DIAGNOSIS — M79609 Pain in unspecified limb: Secondary | ICD-10-CM | POA: Insufficient documentation

## 2013-11-13 DIAGNOSIS — F411 Generalized anxiety disorder: Secondary | ICD-10-CM | POA: Insufficient documentation

## 2013-11-13 DIAGNOSIS — M79661 Pain in right lower leg: Secondary | ICD-10-CM

## 2013-11-13 DIAGNOSIS — F17200 Nicotine dependence, unspecified, uncomplicated: Secondary | ICD-10-CM | POA: Insufficient documentation

## 2013-11-13 DIAGNOSIS — Z8719 Personal history of other diseases of the digestive system: Secondary | ICD-10-CM | POA: Insufficient documentation

## 2013-11-13 DIAGNOSIS — Z8679 Personal history of other diseases of the circulatory system: Secondary | ICD-10-CM | POA: Insufficient documentation

## 2013-11-13 MED ORDER — IBUPROFEN 600 MG PO TABS: 600.0000 mg | ORAL_TABLET | Freq: Three times a day (TID) | ORAL | Status: DC | PRN

## 2013-11-13 MED ORDER — IBUPROFEN 200 MG PO TABS
600.0000 mg | ORAL_TABLET | Freq: Once | ORAL | Status: AC
  Administered 2013-11-13: 600 mg via ORAL
  Filled 2013-11-13: qty 3

## 2013-11-13 NOTE — ED Provider Notes (Signed)
CSN: [id number]     Arrival date & time 11/13/13  0059 History   First MD Initiated Contact with Patient 11/13/13 0128     Chief Complaint  Patient presents with  . Extremity Weakness     (Consider location/radiation/quality/duration/timing/severity/associated sxs/prior Treatment) HPI Patient presents with right calf pain for the past 7 hours. Patient had is having difficulty describing the symptoms. He specifically states he has no numbness or tingling. He has no weakness in the leg. No known trauma. He's had no swelling. No extended travel or immobilization. Patient has a history for frequent emergency department visits. He admits to being anxious about the sensation in his leg and wanted to have it "checked out". He's taking no medications at home for the symptoms. Past Medical History  Diagnosis Date  . GERD (gastroesophageal reflux disease)   . Anxiety   . Dysrhythmia     pt reports irreg heart beat "PACS" per pt    Past Surgical History  Procedure Laterality Date  . Wisdom teeth  2013  . Percutaneous pinning Left 10/08/2013    Procedure: LEFT 5TH FINGER CLOSED REDUCTION PERCUTANEOUS PINNING;  Surgeon: Cheral AlmasNaiping Michael Xu, MD;  Location: [facility] OR;  Service: Orthopedics;  Laterality: Left;   No family history on file. History  Substance Use Topics  . Smoking status: Current Every Day Smoker -- 1.00 packs/day for 4 years    Types: Cigars  . Smokeless tobacco: Not on file  . Alcohol Use: 5.4 oz/week    3 Cans of beer, 6 Shots of liquor per week    Review of Systems  Constitutional: Negative for fever and chills.  Respiratory: Negative for shortness of breath.   Cardiovascular: Negative for chest pain and leg swelling.  Musculoskeletal: Positive for myalgias. Negative for arthralgias.  Skin: Negative for rash and wound.  Neurological: Negative for dizziness, weakness, light-headedness, numbness and headaches.  All other systems reviewed and are negative.     Allergies   Review of patient's allergies indicates no known allergies.  Home Medications   Prior to Admission medications   Medication Sig Start Date End Date Taking? Authorizing Provider  HYDROcodone-acetaminophen (NORCO) 5-325 MG per tablet Take 1-2 tablets by mouth every 6 (six) hours as needed. 10/08/13   Naiping Glee ArvinMichael Xu, MD  HYDROcodone-acetaminophen (NORCO/VICODIN) 5-325 MG per tablet Take 1-2 tablets by mouth every 4 (four) hours as needed for moderate pain.    Historical Provider, MD  meclizine (ANTIVERT) 25 MG tablet Take 1 tablet (25 mg total) by mouth 3 (three) times daily as needed for dizziness. 09/21/13   Hayden Rasmussenavid Mabe, NP   BP 143/86  Pulse 64  Temp(Src) 99.8 F (37.7 C) (Oral)  Resp 16  Ht 5\' 8"  (1.727 m)  Wt 255 lb (115.667 kg)  BMI 38.78 kg/m2  SpO2 97% Physical Exam  Nursing note and vitals reviewed. Constitutional: He is oriented to person, place, and time. He appears well-developed and well-nourished. No distress.  HENT:  Head: Normocephalic and atraumatic.  Mouth/Throat: Oropharynx is clear and moist.  Eyes: EOM are normal. Pupils are equal, round, and reactive to light.  Neck: Normal range of motion. Neck supple.  Cardiovascular: Normal rate and regular rhythm.   Pulmonary/Chest: Effort normal and breath sounds normal. No respiratory distress. He has no wheezes. He has no rales.  Abdominal: Soft. Bowel sounds are normal.  Musculoskeletal: Normal range of motion. He exhibits tenderness (mild tenderness to palpation in the right belly of the calf. There is no obvious swelling  or masses. Legs are equal. ). He exhibits no edema.  Distal pulses intact.  Neurological: He is alert and oriented to person, place, and time.  5/5 motor in all extremities. Sensation is fully intact in all extremities.  Skin: Skin is warm and dry. No rash noted. No erythema.  Psychiatric:  Mildly anxious    ED Course  Procedures (including critical care time) Labs Review Labs Reviewed - No  data to display  Imaging Review No results found.   EKG Interpretation None      MDM   Final diagnoses:  None   I have very low suspicion for DVT. Patient has no significant risk factors. The patient's symptoms appear to be more muscular in origin. We'll start on ibuprofen and have advised return for worsening pain, swelling, shortness of breath, chest pain or for any concerns.    Loren Raceravid Sharolyn Weber, MD 11/13/13 [phone]

## 2013-11-13 NOTE — Discharge Instructions (Signed)
Musculoskeletal Pain °Musculoskeletal pain is muscle and boney aches and pains. These pains can occur in any part of the body. Your caregiver may treat you without knowing the cause of the pain. They may treat you if blood or urine tests, X-rays, and other tests were normal.  °CAUSES °There is often not a definite cause or reason for these pains. These pains may be caused by a type of germ (virus). The discomfort may also come from overuse. Overuse includes working out too hard when your body is not fit. Boney aches also come from weather changes. Bone is sensitive to atmospheric pressure changes. °HOME CARE INSTRUCTIONS  °· Ask when your test results will be ready. Make sure you get your test results. °· Only take over-the-counter or prescription medicines for pain, discomfort, or fever as directed by your caregiver. If you were given medications for your condition, do not drive, operate machinery or power tools, or sign legal documents for 24 hours. Do not drink alcohol. Do not take sleeping pills or other medications that may interfere with treatment. °· Continue all activities unless the activities cause more pain. When the pain lessens, slowly resume normal activities. Gradually increase the intensity and duration of the activities or exercise. °· During periods of severe pain, bed rest may be helpful. Lay or sit in any position that is comfortable. °· Putting ice on the injured area. °· Put ice in a bag. °· Place a towel between your skin and the bag. °· Leave the ice on for 15 to 20 minutes, 3 to 4 times a day. °· Follow up with your caregiver for continued problems and no reason can be found for the pain. If the pain becomes worse or does not go away, it may be necessary to repeat tests or do additional testing. Your caregiver may need to look further for a possible cause. °SEEK IMMEDIATE MEDICAL CARE IF: °· You have pain that is getting worse and is not relieved by medications. °· You develop chest pain  that is associated with shortness or breath, sweating, feeling sick to your stomach (nauseous), or throw up (vomit). °· Your pain becomes localized to the abdomen. °· You develop any new symptoms that seem different or that concern you. °MAKE SURE YOU:  °· Understand these instructions. °· Will watch your condition. °· Will get help right away if you are not doing well or get worse. °Document Released: 07/15/2005 Document Revised: 10/07/2011 Document Reviewed: 03/19/2013 °[employer]® Patient Information ©2014 [employer]. ° °

## 2013-11-13 NOTE — ED Notes (Signed)
Pt c/o bilat leg numbness onset 1700 yesterday. No gait disturbances. Pt states had similar episode the other but it resolved quickly

## 2014-01-17 ENCOUNTER — Encounter: Payer: Self-pay | Admitting: Family Medicine

## 2014-06-30 ENCOUNTER — Emergency Department (HOSPITAL_COMMUNITY)
Admission: EM | Admit: 2014-06-30 | Discharge: 2014-06-30 | Disposition: A | Discharge status: Home / Self Care | Payer: No Typology Code available for payment source | Attending: Emergency Medicine | Admitting: Emergency Medicine

## 2014-06-30 ENCOUNTER — Encounter (HOSPITAL_COMMUNITY): Payer: Self-pay | Admitting: Emergency Medicine

## 2014-06-30 DIAGNOSIS — Z8659 Personal history of other mental and behavioral disorders: Secondary | ICD-10-CM | POA: Insufficient documentation

## 2014-06-30 DIAGNOSIS — Z720 Tobacco use: Secondary | ICD-10-CM | POA: Diagnosis not present

## 2014-06-30 DIAGNOSIS — Z791 Long term (current) use of non-steroidal anti-inflammatories (NSAID): Secondary | ICD-10-CM | POA: Insufficient documentation

## 2014-06-30 DIAGNOSIS — S79921A Unspecified injury of right thigh, initial encounter: Secondary | ICD-10-CM | POA: Insufficient documentation

## 2014-06-30 DIAGNOSIS — Z8679 Personal history of other diseases of the circulatory system: Secondary | ICD-10-CM | POA: Diagnosis not present

## 2014-06-30 DIAGNOSIS — Y998 Other external cause status: Secondary | ICD-10-CM | POA: Insufficient documentation

## 2014-06-30 DIAGNOSIS — Z8719 Personal history of other diseases of the digestive system: Secondary | ICD-10-CM | POA: Diagnosis not present

## 2014-06-30 DIAGNOSIS — S199XXA Unspecified injury of neck, initial encounter: Secondary | ICD-10-CM | POA: Diagnosis not present

## 2014-06-30 DIAGNOSIS — Y9389 Activity, other specified: Secondary | ICD-10-CM | POA: Insufficient documentation

## 2014-06-30 DIAGNOSIS — Y92410 Unspecified street and highway as the place of occurrence of the external cause: Secondary | ICD-10-CM | POA: Insufficient documentation
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
## 2014-06-30 NOTE — Discharge Instructions (Signed)

## 2014-07-15 DIAGNOSIS — Z8679 Personal history of other diseases of the circulatory system: Secondary | ICD-10-CM | POA: Insufficient documentation

## 2014-07-15 DIAGNOSIS — T7840XA Allergy, unspecified, initial encounter: Secondary | ICD-10-CM | POA: Insufficient documentation

## 2014-07-15 DIAGNOSIS — Z8719 Personal history of other diseases of the digestive system: Secondary | ICD-10-CM | POA: Insufficient documentation

## 2014-07-15 DIAGNOSIS — B354 Tinea corporis: Secondary | ICD-10-CM | POA: Insufficient documentation

## 2014-07-15 DIAGNOSIS — Z8659 Personal history of other mental and behavioral disorders: Secondary | ICD-10-CM | POA: Insufficient documentation

## 2014-07-15 DIAGNOSIS — Z720 Tobacco use: Secondary | ICD-10-CM | POA: Insufficient documentation

## 2014-07-16 ENCOUNTER — Encounter (HOSPITAL_COMMUNITY): Payer: Self-pay | Admitting: Emergency Medicine

## 2014-07-16 ENCOUNTER — Emergency Department (HOSPITAL_COMMUNITY)
Admission: EM | Admit: 2014-07-16 | Discharge: 2014-07-16 | Disposition: A | Discharge status: Home / Self Care | Payer: BC Managed Care – PPO | Attending: Emergency Medicine | Admitting: Emergency Medicine

## 2014-07-16 DIAGNOSIS — B354 Tinea corporis: Secondary | ICD-10-CM

## 2014-07-16 MED ORDER — CLOTRIMAZOLE-BETAMETHASONE 1-0.05 % EX CREA: TOPICAL_CREAM | CUTANEOUS | Status: DC

## 2014-07-16 NOTE — ED Notes (Signed)
Pt presents with allergic reaction to unkn allergen intermittent x 2 weeks. Pt noticed rash worse last night @ 2230. Hives noted to chest neck back

## 2014-07-16 NOTE — Discharge Instructions (Signed)

## 2014-07-16 NOTE — ED Provider Notes (Signed)
CSN: [id number]     Arrival date & time 07/15/14  2354 History   First MD Initiated Contact with Patient 07/16/14 0058     Chief Complaint  Patient presents with  . Allergic Reaction     (Consider location/radiation/quality/duration/timing/severity/associated sxs/prior Treatment) HPI Comments: He complains of rash on upper chest that gets better and worsens but never resolves completely. No itching SOB, swelling.   Patient is a 28 y.o. male presenting with allergic reaction. The history is provided by the patient. No language interpreter was used.  Allergic Reaction Presenting symptoms: rash   Presenting symptoms: no difficulty breathing, no difficulty swallowing and no itching   Severity:  Mild Prior allergic episodes:  No prior episodes   Past Medical History  Diagnosis Date  . GERD (gastroesophageal reflux disease)   . Anxiety   . Dysrhythmia     pt reports irreg heart beat "PACS" per pt    Past Surgical History  Procedure Laterality Date  . Wisdom teeth  2013  . Percutaneous pinning Left 10/08/2013    Procedure: LEFT 5TH FINGER CLOSED REDUCTION PERCUTANEOUS PINNING;  Surgeon: Cheral AlmasNaiping Michael Xu, MD;  Location: [facility] OR;  Service: Orthopedics;  Laterality: Left;   Family History  Problem Relation Age of Onset  . Hypertension Mother   . Diabetes Father   . Hypertension Father    History  Substance Use Topics  . Smoking status: Current Every Day Smoker -- 1.00 packs/day for 4 years    Types: Cigars  . Smokeless tobacco: Not on file  . Alcohol Use: Yes     Comment: occ    Review of Systems  Constitutional: Negative for fever and chills.  HENT: Negative.  Negative for trouble swallowing.   Respiratory: Negative.   Musculoskeletal: Negative.   Skin: Positive for rash. Negative for itching.       See HPI.  Neurological: Negative.       Allergies  Aspirin  Home Medications   Prior to Admission medications   Medication Sig Start Date End Date Taking?  Authorizing Provider  Tetrahydrozoline HCl (VISINE OP) Place 1-2 drops into both eyes daily as needed (dry eyes).   Yes Historical Provider, MD  HYDROcodone-acetaminophen (NORCO) 5-325 MG per tablet Take 1-2 tablets by mouth every 6 (six) hours as needed. Patient not taking: Reported on 06/30/2014 10/08/13   Naiping Glee ArvinMichael Xu, MD  ibuprofen (ADVIL,MOTRIN) 600 MG tablet Take 1 tablet (600 mg total) by mouth 3 (three) times daily with meals as needed for mild pain or cramping. Patient not taking: Reported on 06/30/2014 11/13/13   Loren Raceravid Yelverton, MD  meclizine (ANTIVERT) 25 MG tablet Take 1 tablet (25 mg total) by mouth 3 (three) times daily as needed for dizziness. Patient not taking: Reported on 06/30/2014 09/21/13   Hayden Rasmussenavid Mabe, NP   BP 144/92 mmHg  Pulse 60  Temp(Src) 98.1 F (36.7 C) (Oral)  Resp 15  Ht 5\' 8"  (1.727 m)  Wt 223 lb (101.152 kg)  BMI 33.91 kg/m2  SpO2 98% Physical Exam  Constitutional: He is oriented to person, place, and time. He appears well-developed and well-nourished.  Neck: Normal range of motion.  Pulmonary/Chest: Effort normal.  Musculoskeletal: Normal range of motion.  Neurological: He is alert and oriented to person, place, and time.  Skin: Skin is warm and dry.  Macular rash that is mildly hyperpigmented and scaling c/w fungal rash. No hives, welts or other rash.  Psychiatric: He has a normal mood and affect.    ED  Course  Procedures (including critical care time) Labs Review Labs Reviewed - No data to display  Imaging Review No results found.   EKG Interpretation None      MDM   Final diagnoses:  None    1. Tinea corporis  Will provide topical anti-fungal and PCP follow up.    Arnoldo HookerShari A Laylaa Guevarra, PA-C 07/16/14 0122  Derwood KaplanAnkit Nanavati, MD 07/17/14 [phone]

## 2014-10-20 DIAGNOSIS — Z8659 Personal history of other mental and behavioral disorders: Secondary | ICD-10-CM | POA: Insufficient documentation

## 2014-10-20 DIAGNOSIS — K219 Gastro-esophageal reflux disease without esophagitis: Secondary | ICD-10-CM | POA: Insufficient documentation

## 2014-10-20 DIAGNOSIS — Z79899 Other long term (current) drug therapy: Secondary | ICD-10-CM | POA: Insufficient documentation

## 2014-10-20 DIAGNOSIS — Z720 Tobacco use: Secondary | ICD-10-CM | POA: Insufficient documentation

## 2014-10-20 DIAGNOSIS — Z8679 Personal history of other diseases of the circulatory system: Secondary | ICD-10-CM | POA: Insufficient documentation

## 2014-10-21 ENCOUNTER — Emergency Department (HOSPITAL_COMMUNITY): Payer: Self-pay

## 2014-10-21 ENCOUNTER — Encounter (HOSPITAL_COMMUNITY): Payer: Self-pay

## 2014-10-21 ENCOUNTER — Emergency Department (HOSPITAL_COMMUNITY)
Admission: EM | Admit: 2014-10-21 | Discharge: 2014-10-21 | Disposition: A | Discharge status: Home / Self Care | Payer: Self-pay | Attending: Emergency Medicine | Admitting: Emergency Medicine

## 2014-10-21 DIAGNOSIS — K219 Gastro-esophageal reflux disease without esophagitis: Secondary | ICD-10-CM

## 2014-10-21 MED ORDER — GI COCKTAIL ~~LOC~~
30.0000 mL | Freq: Once | ORAL | Status: AC
  Administered 2014-10-21: 30 mL via ORAL
  Filled 2014-10-21: qty 30

## 2014-10-21 MED ORDER — OMEPRAZOLE 20 MG PO CPDR: 20.0000 mg | DELAYED_RELEASE_CAPSULE | Freq: Every day | ORAL | Status: DC

## 2014-10-21 MED ORDER — IBUPROFEN 800 MG PO TABS
800.0000 mg | ORAL_TABLET | Freq: Once | ORAL | Status: AC
  Administered 2014-10-21: 800 mg via ORAL
  Filled 2014-10-21: qty 1

## 2014-10-21 NOTE — ED Provider Notes (Addendum)
CSN: [id number]     Arrival date & time 10/20/14  2322 History   First MD Initiated Contact with Patient 10/21/14 0208     Chief Complaint  Patient presents with  . Cough     (Consider location/radiation/quality/duration/timing/severity/associated sxs/prior Treatment) Patient is a 29 y.o. male presenting with cough. The history is provided by the patient.  Cough Cough characteristics:  Non-productive Severity:  Moderate Onset quality:  Gradual Duration:  3 days Timing:  Intermittent Progression:  Unchanged Chronicity:  New Smoker: yes   Context: not animal exposure   Relieved by:  Nothing Worsened by:  Nothing tried Ineffective treatments:  None tried Associated symptoms: no chest pain, no fever, no shortness of breath and no wheezing   Risk factors: no recent travel   No swelling in the legs no long car trips or plane trips  Past Medical History  Diagnosis Date  . GERD (gastroesophageal reflux disease)   . Anxiety   . Dysrhythmia     pt reports irreg heart beat "PACS" per pt    Past Surgical History  Procedure Laterality Date  . Wisdom teeth  2013  . Percutaneous pinning Left 10/08/2013    Procedure: LEFT 5TH FINGER CLOSED REDUCTION PERCUTANEOUS PINNING;  Surgeon: Cheral Almas, MD;  Location: [facility] OR;  Service: Orthopedics;  Laterality: Left;   Family History  Problem Relation Age of Onset  . Hypertension Mother   . Diabetes Father   . Hypertension Father    History  Substance Use Topics  . Smoking status: Current Every Day Smoker -- 1.00 packs/day for 4 years    Types: Cigars  . Smokeless tobacco: Not on file  . Alcohol Use: Yes     Comment: occ    Review of Systems  Constitutional: Negative for fever.  Respiratory: Positive for cough. Negative for shortness of breath and wheezing.   Cardiovascular: Negative for chest pain, palpitations and leg swelling.  All other systems reviewed and are negative.     Allergies  Aspirin  Home Medications    Prior to Admission medications   Medication Sig Start Date End Date Taking? Authorizing Provider  acetaminophen (TYLENOL) 500 MG tablet Take 500 mg by mouth every 6 (six) hours as needed for moderate pain.   Yes Historical Provider, MD  clotrimazole-betamethasone (LOTRISONE) cream Apply to affected area 2 times daily prn Patient not taking: Reported on 10/21/2014 07/16/14   Elpidio Anis, PA-C  HYDROcodone-acetaminophen (NORCO) 5-325 MG per tablet Take 1-2 tablets by mouth every 6 (six) hours as needed. Patient not taking: Reported on 06/30/2014 10/08/13   Tarry Kos, MD  ibuprofen (ADVIL,MOTRIN) 600 MG tablet Take 1 tablet (600 mg total) by mouth 3 (three) times daily with meals as needed for mild pain or cramping. Patient not taking: Reported on 06/30/2014 11/13/13   Loren Racer, MD  meclizine (ANTIVERT) 25 MG tablet Take 1 tablet (25 mg total) by mouth 3 (three) times daily as needed for dizziness. Patient not taking: Reported on 06/30/2014 09/21/13   Hayden Rasmussen, NP   BP 131/78 mmHg  Pulse 64  Temp(Src) 98.3 F (36.8 C) (Oral)  Resp 20  SpO2 99% Physical Exam  Constitutional: He is oriented to person, place, and time. He appears well-developed and well-nourished. No distress.  HENT:  Head: Normocephalic and atraumatic.  Mouth/Throat: Oropharynx is clear and moist.  Eyes: Conjunctivae are normal. Pupils are equal, round, and reactive to light.  Neck: Normal range of motion. Neck supple.  Cardiovascular: Normal rate,  regular rhythm and intact distal pulses.   Pulmonary/Chest: Effort normal and breath sounds normal. No respiratory distress. He has no wheezes. He has no rales. He exhibits no tenderness.  Abdominal: Soft. Bowel sounds are increased. There is no tenderness. There is no rigidity, no rebound, no guarding, no tenderness at McBurney's point and negative Murphy's sign.  gurgling and bowel sounds heard into the thoracic cavity  Musculoskeletal: Normal range of motion. He  exhibits no edema or tenderness.  Neurological: He is alert and oriented to person, place, and time.  Skin: Skin is warm and dry.  Psychiatric: He has a normal mood and affect.    ED Course  Procedures (including critical care time) Labs Review Labs Reviewed - No data to display  Imaging Review Dg Chest 2 View (if Patient Has Fever And/or Copd)  10/21/2014   CLINICAL DATA:  Acute onset of left-sided chest pain with inspiration for several days. Occasional cough. Initial encounter.  EXAM: CHEST  2 VIEW  COMPARISON:  Chest radiograph performed 04/26/2008  FINDINGS: The lungs are well-aerated and clear. There is no evidence of focal opacification, pleural effusion or pneumothorax.  The heart is normal in size; the mediastinal contour is within normal limits. No acute osseous abnormalities are seen.  IMPRESSION: No acute cardiopulmonary process seen.   Electronically Signed   By: Roanna RaiderJeffery  Chang M.D.   On: 10/21/2014 02:36     EKG Interpretation None      MDM   Final diagnoses:  None  PERC negative wells 0  Suspect this is actually GERD.  Will treat for same.  Strict return precautions given    Markeesha Char, MD 10/21/14 [id number]  Cy BlamerApril Lindzee Gouge, MD 10/21/14 [id number]

## 2014-10-21 NOTE — ED Notes (Signed)
Patient reports he has had pain with inspiration for several days.  He reports an occasional cough, sometimes productive.  No signs of respiratory distress noted.

## 2014-12-12 ENCOUNTER — Ambulatory Visit: Payer: Self-pay | Admitting: Family

## 2014-12-13 ENCOUNTER — Ambulatory Visit: Payer: Self-pay | Admitting: Family

## 2014-12-29 ENCOUNTER — Ambulatory Visit: Payer: Self-pay | Admitting: Family

## 2014-12-29 DIAGNOSIS — Z0289 Encounter for other administrative examinations: Secondary | ICD-10-CM

## 2015-04-29 ENCOUNTER — Encounter (HOSPITAL_COMMUNITY): Payer: Self-pay | Admitting: Nurse Practitioner

## 2015-04-29 ENCOUNTER — Emergency Department (HOSPITAL_COMMUNITY)
Admission: EM | Admit: 2015-04-29 | Discharge: 2015-04-30 | Disposition: A | Discharge status: Home / Self Care | Payer: Self-pay | Attending: Emergency Medicine | Admitting: Emergency Medicine

## 2015-04-29 DIAGNOSIS — Z720 Tobacco use: Secondary | ICD-10-CM | POA: Insufficient documentation

## 2015-04-29 DIAGNOSIS — R1012 Left upper quadrant pain: Secondary | ICD-10-CM | POA: Insufficient documentation

## 2015-04-29 DIAGNOSIS — R509 Fever, unspecified: Secondary | ICD-10-CM | POA: Insufficient documentation

## 2015-04-29 DIAGNOSIS — Z8659 Personal history of other mental and behavioral disorders: Secondary | ICD-10-CM | POA: Insufficient documentation

## 2015-04-29 DIAGNOSIS — R5381 Other malaise: Secondary | ICD-10-CM | POA: Insufficient documentation

## 2015-04-29 DIAGNOSIS — R110 Nausea: Secondary | ICD-10-CM | POA: Insufficient documentation

## 2015-04-29 DIAGNOSIS — Z8719 Personal history of other diseases of the digestive system: Secondary | ICD-10-CM | POA: Insufficient documentation

## 2015-04-29 DIAGNOSIS — Z8679 Personal history of other diseases of the circulatory system: Secondary | ICD-10-CM | POA: Insufficient documentation

## 2015-04-29 LAB — COMPREHENSIVE METABOLIC PANEL
ALT: 18 U/L (ref 17–63)
AST: 28 U/L (ref 15–41)
Albumin: 5.1 g/dL — ABNORMAL HIGH (ref 3.5–5.0)
Alkaline Phosphatase: 70 U/L (ref 38–126)
Anion gap: 6 (ref 5–15)
BUN: 13 mg/dL (ref 6–20)
CO2: 26 mmol/L (ref 22–32)
Calcium: 9.5 mg/dL (ref 8.9–10.3)
Chloride: 105 mmol/L (ref 101–111)
Creatinine, Ser: 1.22 mg/dL (ref 0.61–1.24)
GFR calc Af Amer: >60 mL/min (ref >60)
GFR calc non Af Amer: >60 mL/min (ref >60)
Glucose, Bld: 95 mg/dL (ref 65–99)
Potassium: 4.1 mmol/L (ref 3.5–5.1)
Sodium: 137 mmol/L (ref 135–145)
Total Bilirubin: 0.8 mg/dL (ref 0.3–1.2)
Total Protein: 8.6 g/dL — ABNORMAL HIGH (ref 6.5–8.1)

## 2015-04-29 LAB — CBC
HCT: 45 % (ref 39.0–52.0)
Hemoglobin: 15.6 g/dL (ref 13.0–17.0)
MCH: 30.9 pg (ref 26.0–34.0)
MCHC: 34.7 g/dL (ref 30.0–36.0)
MCV: 89.1 fL (ref 78.0–100.0)
Platelets: 129 10*3/uL — ABNORMAL LOW (ref 150–400)
RBC: 5.05 MIL/uL (ref 4.22–5.81)
RDW: 13.2 % (ref 11.5–15.5)
WBC: 7.6 10*3/uL (ref 4.0–10.5)

## 2015-04-29 LAB — LIPASE, BLOOD: Lipase: 24 U/L (ref 22–51)

## 2015-04-29 NOTE — ED Provider Notes (Signed)
CSN: [id number]     Arrival date & time 04/29/15  2029 History   First MD Initiated Contact with Patient 04/29/15 2139     Chief Complaint  Patient presents with  . General Malaise     Not feeling good   HPI  Brian Anderson is a 29 year old male presenting with vague complaints of "not feeling good". Pt states that he began "feeling bad" 2 days ago. He states that he has had intermittent chills and sweats last evening. He did not take his temperature at home but states he got "hotter than usual then I felt chilly". He is also complaining of intermittent LUQ pain. He describes it as "tightening". The pain is exacerbated by "hunching forward like when I'm sitting and working". The pain is relieved by rest. He has not tried anything for pain relief. He also endorses intermittent nausea today but states "that's probably from not eating today". He endorses "just feeling sort of tired, maybe some weakness but not like muscle weakness, just tired weakness. Maybe it's because I haven't slept in like 2 days". Pt reports a history of a heart dysrhythmia that he describes as "going sometimes from around 60 to like 90 a few minutes later". He was previously scheduled to see a cardiologist for this but reports that he never went to the appointment. He states that he is currently not experiencing any symptoms but "felt like I should get checked out". Denies diaphoresis, dizziness, lightheadedness, headaches, chest pain, SOB, palpitations, vomiting, diarrhea or dysuria.   Past Medical History  Diagnosis Date  . GERD (gastroesophageal reflux disease)   . Anxiety   . Dysrhythmia     pt reports irreg heart beat "PACS" per pt    Past Surgical History  Procedure Laterality Date  . Wisdom teeth  2013  . Percutaneous pinning Left 10/08/2013    Procedure: LEFT 5TH FINGER CLOSED REDUCTION PERCUTANEOUS PINNING;  Surgeon: Cheral Almas, MD;  Location: [facility] OR;  Service: Orthopedics;  Laterality: Left;   Family History   Problem Relation Age of Onset  . Hypertension Mother   . Diabetes Father   . Hypertension Father    Social History  Substance Use Topics  . Smoking status: Current Every Day Smoker -- 1.00 packs/day for 4 years    Types: Cigars  . Smokeless tobacco: None  . Alcohol Use: Yes     Comment: occ    Review of Systems  Constitutional: Positive for fever, chills and fatigue. Negative for diaphoresis.  Respiratory: Negative for shortness of breath.   Cardiovascular: Negative for chest pain and palpitations.  Gastrointestinal: Positive for nausea and abdominal pain. Negative for vomiting and diarrhea.  Genitourinary: Negative for dysuria.  Neurological: Negative for dizziness, light-headedness and headaches.  All other systems reviewed and are negative.     Allergies  Aspirin  Home Medications   Prior to Admission medications   Medication Sig Start Date End Date Taking? Authorizing Provider  acetaminophen (TYLENOL) 500 MG tablet Take 500 mg by mouth every 6 (six) hours as needed for moderate pain.   Yes Historical Provider, MD  clotrimazole-betamethasone (LOTRISONE) cream Apply to affected area 2 times daily prn Patient not taking: Reported on 10/21/2014 07/16/14   Elpidio Anis, PA-C  meclizine (ANTIVERT) 25 MG tablet Take 1 tablet (25 mg total) by mouth 3 (three) times daily as needed for dizziness. Patient not taking: Reported on 06/30/2014 09/21/13   Hayden Rasmussen, NP  omeprazole (PRILOSEC) 20 MG capsule Take 1  capsule (20 mg total) by mouth daily. Patient not taking: Reported on 04/29/2015 10/21/14   April Palumbo, MD   BP 144/62 mmHg  Pulse 59  Temp(Src) 98.1 F (36.7 C) (Oral)  Resp 18  SpO2 100% Physical Exam  Constitutional: He appears well-developed and well-nourished. No distress.  HENT:  Head: Normocephalic and atraumatic.  Eyes: Conjunctivae are normal. Right eye exhibits no discharge. Left eye exhibits no discharge. No scleral icterus.  Neck: Normal range of motion.   Cardiovascular: Normal rate, regular rhythm and normal heart sounds.   Pulmonary/Chest: Effort normal and breath sounds normal. No respiratory distress. He has no wheezes. He has no rales.  Abdominal: Soft. Bowel sounds are normal. He exhibits no distension. There is no tenderness. There is no rebound and no guarding.  Musculoskeletal: Normal range of motion.  Pt moves all extremities spontaneously and walks with a steady gait  Neurological: He is alert. Coordination normal.  Skin: Skin is warm and dry.  Psychiatric: He has a normal mood and affect. His behavior is normal.  Nursing note and vitals reviewed.   ED Course  Procedures (including critical care time) Labs Review Labs Reviewed  COMPREHENSIVE METABOLIC PANEL - Abnormal; Notable for the following:    Total Protein 8.6 (*)    Albumin 5.1 (*)    All other components within normal limits  CBC - Abnormal; Notable for the following:    Platelets 129 (*)    All other components within normal limits  LIPASE, BLOOD    Imaging Review No results found. I have personally reviewed and evaluated these images and lab results as part of my medical decision-making.   EKG Interpretation None      MDM   Final diagnoses:  Malaise  Left upper quadrant pain   Pt presenting with vague complaints of "not feeling well" as noted above. Also complaining of intermittent LUQ pain, fevers, chills and nausea. Pt asked about his vital signs during interview and when I told them they all looked good he stated "ok that's good, I can just leave now and stop wasting your time, I just wanted to make sure everything like that was ok". Discussed with pt that we had already drawn some labwork and I would like him to stay for full evaluation. Pt agreed. VSS. Pt nontoxic appearing and sitting comfortably. Pt denies current symptoms. Abdomen is soft, non-tender. Bloodwork reassuring. Pt is reassured that his vitals are "normal" and says he thinks he "just  needs to sleep, I've been working so hard the past few days". Gave pt community health and wellness information and encouraged to establish primary care. Return precautions given in discharge paperwork and discussed with pt at bedside. Pt stable for discharge     Alveta Heimlich, PA-C 04/30/15 1127  Raeford Razor, MD 05/04/15 1434

## 2015-04-29 NOTE — ED Notes (Signed)
Pt states he is not feeling good, his symptoms include: general weakness, fever, chills, dizziness and abdominal pain 6/10, all these intermittently. Onset 2 days ago.

## 2015-04-30 NOTE — Discharge Instructions (Signed)
Abdominal Pain Many things can cause abdominal pain. Usually, abdominal pain is not caused by a disease and will improve without treatment. It can often be observed and treated at home. Your health care provider will do a physical exam and possibly order blood tests and X-rays to help determine the seriousness of your pain. However, in many cases, more time must pass before a clear cause of the pain can be found. Before that point, your health care provider may not know if you need more testing or further treatment. HOME CARE INSTRUCTIONS  Monitor your abdominal pain for any changes. The following actions may help to alleviate any discomfort you are experiencing:  Only take over-the-counter or prescription medicines as directed by your health care provider.  Do not take laxatives unless directed to do so by your health care provider.  Try a clear liquid diet (broth, tea, or water) as directed by your health care provider. Slowly move to a bland diet as tolerated. SEEK MEDICAL CARE IF:  You have unexplained abdominal pain.  You have abdominal pain associated with nausea or diarrhea.  You have pain when you urinate or have a bowel movement.  You experience abdominal pain that wakes you in the night.  You have abdominal pain that is worsened or improved by eating food.  You have abdominal pain that is worsened with eating fatty foods.  You have a fever. SEEK IMMEDIATE MEDICAL CARE IF:   Your pain does not go away within 2 hours.  You keep throwing up (vomiting).  Your pain is felt only in portions of the abdomen, such as the right side or the left lower portion of the abdomen.  You pass bloody or black tarry stools. MAKE SURE YOU:  Understand these instructions.   Will watch your condition.   Will get help right away if you are not doing well or get worse.  Document Released: 04/24/2005 Document Revised: 07/20/2013 Document Reviewed: 03/24/2013 [facility] Patient Information  2015 [place], [place]. This information is not intended to replace advice given to you by your health care provider. Make sure you discuss any questions you have with your health care provider.  Fatigue Fatigue is a feeling of tiredness, lack of energy, lack of motivation, or feeling tired all the time. Having enough rest, good nutrition, and reducing stress will normally reduce fatigue. Consult your caregiver if it persists. The nature of your fatigue will help your caregiver to find out its cause. The treatment is based on the cause.  CAUSES  There are many causes for fatigue. Most of the time, fatigue can be traced to one or more of your habits or routines. Most causes fit into one or more of three general areas. They are: Lifestyle problems  Sleep disturbances.  Overwork.  Physical exertion.  Unhealthy habits.  Poor eating habits or eating disorders.  Alcohol and/or drug use .  Lack of proper nutrition (malnutrition). Psychological problems  Stress and/or anxiety problems.  Depression.  Grief.  Boredom. Medical Problems or Conditions  Anemia.  Pregnancy.  Thyroid gland problems.  Recovery from major surgery.  Continuous pain.  Emphysema or asthma that is not well controlled  Allergic conditions.  Diabetes.  Infections (such as mononucleosis).  Obesity.  Sleep disorders, such as sleep apnea.  Heart failure or other heart-related problems.  Cancer.  Kidney disease.  Liver disease.  Effects of certain medicines such as antihistamines, cough and cold remedies, prescription pain medicines, heart and blood pressure medicines, drugs used for treatment  of cancer, and some antidepressants. SYMPTOMS  The symptoms of fatigue include:   Lack of energy.  Lack of drive (motivation).  Drowsiness.  Feeling of indifference to the surroundings. DIAGNOSIS  The details of how you feel help guide your caregiver in finding out what is causing the fatigue. You  will be asked about your present and past health condition. It is important to review all medicines that you take, including prescription and non-prescription items. A thorough exam will be done. You will be questioned about your feelings, habits, and normal lifestyle. Your caregiver may suggest blood tests, urine tests, or other tests to look for common medical causes of fatigue.  TREATMENT  Fatigue is treated by correcting the underlying cause. For example, if you have continuous pain or depression, treating these causes will improve how you feel. Similarly, adjusting the dose of certain medicines will help in reducing fatigue.  HOME CARE INSTRUCTIONS   Try to get the required amount of good sleep every night.  Eat a healthy and nutritious diet, and drink enough water throughout the day.  Practice ways of relaxing (including yoga or meditation).  Exercise regularly.  Make plans to change situations that cause stress. Act on those plans so that stresses decrease over time. Keep your work and personal routine reasonable.  Avoid street drugs and minimize use of alcohol.  Start taking a daily multivitamin after consulting your caregiver. SEEK MEDICAL CARE IF:   You have persistent tiredness, which cannot be accounted for.  You have fever.  You have unintentional weight loss.  You have headaches.  You have disturbed sleep throughout the night.  You are feeling sad.  You have constipation.  You have dry skin.  You have gained weight.  You are taking any new or different medicines that you suspect are causing fatigue.  You are unable to sleep at night.  You develop any unusual swelling of your legs or other parts of your body. SEEK IMMEDIATE MEDICAL CARE IF:   You are feeling confused.  Your vision is blurred.  You feel faint or pass out.  You develop severe headache.  You develop severe abdominal, pelvic, or back pain.  You develop chest pain, shortness of breath,  or an irregular or fast heartbeat.  You are unable to pass a normal amount of urine.  You develop abnormal bleeding such as bleeding from the rectum or you vomit blood.  You have thoughts about harming yourself or committing suicide.  You are worried that you might harm someone else. MAKE SURE YOU:   Understand these instructions.  Will watch your condition.  Will get help right away if you are not doing well or get worse. Document Released: 05/12/2007 Document Revised: 10/07/2011 Document Reviewed: 11/16/2013 [facility] Patient Information 2015 [place], [place]. This information is not intended to replace advice given to you by your health care provider. Make sure you discuss any questions you have with your health care provider.

## 2015-11-19 ENCOUNTER — Emergency Department (HOSPITAL_COMMUNITY): Payer: No Typology Code available for payment source

## 2015-11-19 ENCOUNTER — Emergency Department (HOSPITAL_COMMUNITY)
Admission: EM | Admit: 2015-11-19 | Discharge: 2015-11-19 | Disposition: A | Discharge status: Home / Self Care | Payer: No Typology Code available for payment source | Attending: Emergency Medicine | Admitting: Emergency Medicine

## 2015-11-19 ENCOUNTER — Encounter (HOSPITAL_COMMUNITY): Payer: Self-pay | Admitting: Oncology

## 2015-11-19 DIAGNOSIS — Z79899 Other long term (current) drug therapy: Secondary | ICD-10-CM | POA: Insufficient documentation

## 2015-11-19 DIAGNOSIS — K219 Gastro-esophageal reflux disease without esophagitis: Secondary | ICD-10-CM | POA: Insufficient documentation

## 2015-11-19 DIAGNOSIS — Y9389 Activity, other specified: Secondary | ICD-10-CM | POA: Insufficient documentation

## 2015-11-19 DIAGNOSIS — Z87891 Personal history of nicotine dependence: Secondary | ICD-10-CM | POA: Insufficient documentation

## 2015-11-19 DIAGNOSIS — Y999 Unspecified external cause status: Secondary | ICD-10-CM | POA: Insufficient documentation

## 2015-11-19 DIAGNOSIS — Y92410 Unspecified street and highway as the place of occurrence of the external cause: Secondary | ICD-10-CM | POA: Insufficient documentation

## 2015-11-19 DIAGNOSIS — S63502A Unspecified sprain of left wrist, initial encounter: Secondary | ICD-10-CM

## 2015-11-19 NOTE — ED Notes (Signed)
PT DISCHARGED. INSTRUCTIONS GIVEN. AAOX3. PT IN NO APPARENT DISTRESS. THE OPPORTUNITY TO ASK QUESTIONS WAS PROVIDED. 
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
## 2016-08-15 ENCOUNTER — Emergency Department (HOSPITAL_COMMUNITY)
Admission: EM | Admit: 2016-08-15 | Discharge: 2016-08-15 | Disposition: A | Discharge status: Home / Self Care | Payer: Self-pay | Attending: Emergency Medicine | Admitting: Emergency Medicine

## 2016-08-15 ENCOUNTER — Emergency Department (HOSPITAL_COMMUNITY): Payer: Self-pay

## 2016-08-15 ENCOUNTER — Encounter (HOSPITAL_COMMUNITY): Payer: Self-pay

## 2016-08-15 DIAGNOSIS — R0789 Other chest pain: Secondary | ICD-10-CM

## 2016-08-15 DIAGNOSIS — Z87891 Personal history of nicotine dependence: Secondary | ICD-10-CM | POA: Insufficient documentation

## 2016-08-15 LAB — BASIC METABOLIC PANEL
ANION GAP: 7 (ref 5–15)
BUN: 10 mg/dL (ref 6–20)
CHLORIDE: 103 mmol/L (ref 101–111)
CO2: 27 mmol/L (ref 22–32)
Calcium: 9.3 mg/dL (ref 8.9–10.3)
Creatinine, Ser: 1.27 mg/dL — ABNORMAL HIGH (ref 0.61–1.24)
GFR calc Af Amer: >60 mL/min (ref >60)
GFR calc non Af Amer: >60 mL/min (ref >60)
GLUCOSE: 107 mg/dL — AB (ref 65–99)
POTASSIUM: 3.2 mmol/L — AB (ref 3.5–5.1)
Sodium: 137 mmol/L (ref 135–145)

## 2016-08-15 LAB — I-STAT TROPONIN, ED: Troponin i, poc: 0 ng/mL (ref 0.00–0.08)

## 2016-08-15 LAB — CBC
HCT: 40.5 % (ref 39.0–52.0)
HEMOGLOBIN: 14 g/dL (ref 13.0–17.0)
MCH: 29.8 pg (ref 26.0–34.0)
MCHC: 34.6 g/dL (ref 30.0–36.0)
MCV: 86.2 fL (ref 78.0–100.0)
Platelets: 138 10*3/uL — ABNORMAL LOW (ref 150–400)
RBC: 4.7 MIL/uL (ref 4.22–5.81)
RDW: 13.2 % (ref 11.5–15.5)
WBC: 6.9 10*3/uL (ref 4.0–10.5)

## 2016-08-15 NOTE — Discharge Instructions (Signed)
Take 4 over the counter ibuprofen tablets 3 times a day or 2 over-the-counter naproxen tablets twice a day for pain. Also take tylenol 1000mg(2 extra strength) four times a day.    

## 2016-08-15 NOTE — ED Triage Notes (Signed)
PT C/O LEFT-SIDED CHEST PAIN WITH SOB SINCE 1:45 PM TODAY. PT STS THIS HAS BEEN ON GOING FOR YEARS, BUT HE WAS NEVER CHECKED FOR IT. PT STS EMS CAME OUT TO HIS HOME TODAY AND SUGGESTED HE BE EVALUATED DUE TO THE EKG AND BP FINDINGS.

## 2016-08-15 NOTE — ED Provider Notes (Signed)
[facility]-EMERGENCY DEPT Provider Note   CSN: [id number] Arrival date & time: 08/15/16  1453     History   Chief Complaint Chief Complaint  Patient presents with  . Chest Pain    HPI Brian Anderson is a 31 y.o. male.  31 yo M with a cc of left chest pain.  This been going on for many years. His left-sided pleuritic in nature. Worse with palpation. Also worse with coughing. Denies fevers chills hemoptysis lower extremity edema recent surgery prolonged travel testosterone use. He thinks some of the symptoms may be exertional. He does smoke. No family history of MI.   The history is provided by the patient.  Chest Pain   This is a chronic problem. The current episode started more than 1 week ago. The problem occurs constantly. The problem has not changed since onset.The pain is associated with movement. The pain is present in the lateral region. The pain is at a severity of 7/10. The pain is moderate. The quality of the pain is described as brief. The pain does not radiate. Pertinent negatives include no abdominal pain, no fever, no headaches, no palpitations, no shortness of breath and no vomiting. He has tried nothing for the symptoms. The treatment provided no relief. There are no known risk factors.    Past Medical History:  Diagnosis Date  . Anxiety   . Dysrhythmia    pt reports irreg heart beat "PACS" per pt   . GERD (gastroesophageal reflux disease)     There are no active problems to display for this patient.   Past Surgical History:  Procedure Laterality Date  . PERCUTANEOUS PINNING Left 10/08/2013   Procedure: LEFT 5TH FINGER CLOSED REDUCTION PERCUTANEOUS PINNING;  Surgeon: Cheral Almas, MD;  Location: [facility] OR;  Service: Orthopedics;  Laterality: Left;  . wisdom teeth  2013       Home Medications    Prior to Admission medications   Medication Sig Start Date End Date Taking? Authorizing Provider  acetaminophen (TYLENOL) 500 MG tablet Take 500 mg by mouth every 6  (six) hours as needed for moderate pain.    Historical Provider, MD  clotrimazole-betamethasone (LOTRISONE) cream Apply to affected area 2 times daily prn Patient not taking: Reported on 10/21/2014 07/16/14   Elpidio Anis, PA-C  meclizine (ANTIVERT) 25 MG tablet Take 1 tablet (25 mg total) by mouth 3 (three) times daily as needed for dizziness. Patient not taking: Reported on 06/30/2014 09/21/13   Hayden Rasmussen, NP  omeprazole (PRILOSEC) 20 MG capsule Take 1 capsule (20 mg total) by mouth daily. Patient not taking: Reported on 04/29/2015 10/21/14   April Palumbo, MD    Family History Family History  Problem Relation Age of Onset  . Hypertension Mother   . Diabetes Father   . Hypertension Father     Social History Social History  Substance Use Topics  . Smoking status: Former Smoker    Packs/day: 1.00    Years: 4.00    Types: Cigars  . Smokeless tobacco: Never Used  . Alcohol use Yes     Comment: occ     Allergies   Aspirin   Review of Systems Review of Systems  Constitutional: Negative for chills and fever.  HENT: Negative for congestion and facial swelling.   Eyes: Negative for discharge and visual disturbance.  Respiratory: Negative for shortness of breath.   Cardiovascular: Positive for chest pain. Negative for palpitations.  Gastrointestinal: Negative for abdominal pain, diarrhea and vomiting.  Musculoskeletal:  Negative for arthralgias and myalgias.  Skin: Negative for color change and rash.  Neurological: Negative for tremors, syncope and headaches.  Psychiatric/Behavioral: Negative for confusion and dysphoric mood.     Physical Exam Updated Vital Signs BP (!) 158/108 (BP Location: Right Arm)   Pulse 75   Temp 98.8 F (37.1 C) (Oral)   Resp 18   Ht 5\' 8"  (1.727 m)   Wt 222 lb 1.6 oz (100.7 kg)   SpO2 100%   BMI 33.77 kg/m   Physical Exam  Constitutional: He is oriented to person, place, and time. He appears well-developed and well-nourished.  HENT:    Head: Normocephalic and atraumatic.  Eyes: EOM are normal. Pupils are equal, round, and reactive to light.  Neck: Normal range of motion. Neck supple. No JVD present.  Cardiovascular: Normal rate and regular rhythm.  Exam reveals no gallop and no friction rub.   No murmur heard. Pulmonary/Chest: No respiratory distress. He has no wheezes. He exhibits tenderness (mild left lateral, pinpoint).  Abdominal: He exhibits no distension and no mass. There is no tenderness. There is no rebound and no guarding.  Musculoskeletal: Normal range of motion.  Neurological: He is alert and oriented to person, place, and time.  Skin: No rash noted. No pallor.  Psychiatric: He has a normal mood and affect. His behavior is normal.  Nursing note and vitals reviewed.    ED Treatments / Results  Labs (all labs ordered are listed, but only abnormal results are displayed) Labs Reviewed  BASIC METABOLIC PANEL - Abnormal; Notable for the following:       Result Value   Potassium 3.2 (*)    Glucose, Bld 107 (*)    Creatinine, Ser 1.27 (*)    All other components within normal limits  CBC - Abnormal; Notable for the following:    Platelets 138 (*)    All other components within normal limits  I-STAT TROPOININ, ED    EKG  EKG Interpretation  Date/Time:  Thursday August 15 2016 15:02:52 EST Ventricular Rate:  92 PR Interval:    QRS Duration: 94 QT Interval:  338 QTC Calculation: 419 R Axis:   71 Text Interpretation:  Sinus rhythm Borderline T wave abnormalities No significant change since last tracing Confirmed by Ginamarie Banfield MD, Reuel Boom [phone]) on 08/15/2016 4:46:10 PM       Radiology Dg Chest 2 View  Result Date: 08/15/2016 CLINICAL DATA:  Left chest pain and shortness of breath acutely since earlier today EXAM: CHEST  2 VIEW COMPARISON:  10/21/2014 FINDINGS: The heart size and mediastinal contours are within normal limits. Both lungs are clear. The visualized skeletal structures are unremarkable.  IMPRESSION: No active cardiopulmonary disease. Electronically Signed   By: Judie Petit.  Shick M.D.   On: 08/15/2016 15:46    Procedures Procedures (including critical care time)  Medications Ordered in ED Medications - No data to display   Initial Impression / Assessment and Plan / ED Course  I have reviewed the triage vital signs and the nursing notes.  Pertinent labs & imaging results that were available during my care of the patient were reviewed by me and considered in my medical decision making (see chart for details).     31 yo M With a chief complaint of left-sided chest pain. Reproducible on exam. Suspect muscular skeletal in nature. Trial of NSAIDs. PCP follow-up.  5:43 PM:  I have discussed the diagnosis/risks/treatment options with the patient and family and believe the pt to be eligible for  discharge home to follow-up with PCP. We also discussed returning to the ED immediately if new or worsening sx occur. We discussed the sx which are most concerning (e.g., sudden worsening pain, fever, inability to tolerate by mouth) that necessitate immediate return. Medications administered to the patient during their visit and any new prescriptions provided to the patient are listed below.  Medications given during this visit Medications - No data to display   The patient appears reasonably screen and/or stabilized for discharge and I doubt any other medical condition or other [facility] requiring further screening, evaluation, or treatment in the ED at this time prior to discharge.    Final Clinical Impressions(s) / ED Diagnoses   Final diagnoses:  Chest wall pain    New Prescriptions New Prescriptions   No medications on file     Melene PlanDan Chancie Lampert, DO 08/15/16 1743

## 2016-08-15 NOTE — ED Notes (Signed)
Pt c/o left chest pain that started around 1:45 today. Patient states he was cooking when the pain started. Patient denies injuries. Patient reports this pain has been ongoing for "years" but happens for longer when it occurs now. Patient states the pain is worse when he takes a deep breathe in.

## 2016-11-20 ENCOUNTER — Encounter (HOSPITAL_COMMUNITY): Payer: Self-pay | Admitting: Emergency Medicine

## 2016-11-20 ENCOUNTER — Ambulatory Visit (HOSPITAL_COMMUNITY)
Admission: EM | Admit: 2016-11-20 | Discharge: 2016-11-20 | Disposition: A | Discharge status: Home / Self Care | Payer: Self-pay | Attending: Family Medicine | Admitting: Family Medicine

## 2016-11-20 DIAGNOSIS — R590 Localized enlarged lymph nodes: Secondary | ICD-10-CM

## 2016-11-20 MED ORDER — DOXYCYCLINE HYCLATE 100 MG PO TABS: 100.0000 mg | ORAL_TABLET | Freq: Two times a day (BID) | ORAL | 0 refills | Status: DC

## 2016-11-20 NOTE — ED Provider Notes (Signed)
[facility]    CSN: [id number] Arrival date & time: 11/20/16  1478     History   Chief Complaint Chief Complaint  Patient presents with  . Lymphadenopathy    HPI Brian Anderson is a 31 y.o. male.   The patient preseented to the [facility] with a complaint of a painful and swollen lymph node on the right side of his neck x 2 days. Patient uses reading expanders in the low blood his years and recently increases size to make a bigger hole. Since that time he's had some peeling of the lobe as well as swelling of the gland just below the ear. He does have some clear tenderness. He has no dental tenderness or sore throat. Is running no fever.  Patient works on a [employer] cabin crew      Past Medical History:  Diagnosis Date  . Anxiety   . Dysrhythmia    pt reports irreg heart beat "PACS" per pt   . GERD (gastroesophageal reflux disease)     There are no active problems to display for this patient.   Past Surgical History:  Procedure Laterality Date  . PERCUTANEOUS PINNING Left 10/08/2013   Procedure: LEFT 5TH FINGER CLOSED REDUCTION PERCUTANEOUS PINNING;  Surgeon: Cheral Almas, MD;  Location: [facility] OR;  Service: Orthopedics;  Laterality: Left;  . wisdom teeth  2013       Home Medications    Prior to Admission medications   Medication Sig Start Date End Date Taking? Authorizing Provider  doxycycline (VIBRA-TABS) 100 MG tablet Take 1 tablet (100 mg total) by mouth 2 (two) times daily. 11/20/16   Elvina Sidle, MD    Family History Family History  Problem Relation Age of Onset  . Hypertension Mother   . Diabetes Father   . Hypertension Father     Social History Social History  Substance Use Topics  . Smoking status: Former Smoker    Packs/day: 1.00    Years: 4.00    Types: Cigars  . Smokeless tobacco: Never Used  . Alcohol use Yes     Comment: occ     Allergies   Aspirin   Review of Systems Review of Systems  Constitutional: Negative.   HENT:  Positive for ear pain.      Physical Exam Triage Vital Signs ED Triage Vitals  Enc Vitals Group     BP 11/20/16 1018 (!) 156/95     Pulse Rate 11/20/16 1018 69     Resp 11/20/16 1018 16     Temp 11/20/16 1018 98.7 F (37.1 C)     Temp Source 11/20/16 1018 Oral     SpO2 11/20/16 1018 100 %     Weight --      Height --      Head Circumference --      Peak Flow --      Pain Score 11/20/16 1016 6     Pain Loc --      Pain Edu? --      Excl. in GC? --    No data found.   Updated Vital Signs BP (!) 156/95 (BP Location: Left Arm)   Pulse 69   Temp 98.7 F (37.1 C) (Oral)   Resp 16   SpO2 100%   Visual Acuity Right Eye Distance:   Left Eye Distance:   Bilateral Distance:    Right Eye Near:   Left Eye Near:    Bilateral Near:     Physical Exam  Constitutional: He is oriented to person, place, and time. He appears well-developed and well-nourished.  HENT:  Left Ear: External ear normal.  Mouth/Throat: Oropharynx is clear and moist.  Peeling and mildly erythematous right earlobe with ring expander in the lobe.  Eyes: Conjunctivae and EOM are normal. Pupils are equal, round, and reactive to light.  Neck: Normal range of motion. Neck supple.  1 cm tender node below the right ear lobe  Pulmonary/Chest: Effort normal.  Musculoskeletal: Normal range of motion.  Lymphadenopathy:    He has cervical adenopathy.  Neurological: He is alert and oriented to person, place, and time.  Skin: Skin is warm and dry. There is erythema.  Nursing note and vitals reviewed.    UC Treatments / Results  Labs (all labs ordered are listed, but only abnormal results are displayed) Labs Reviewed - No data to display  EKG  EKG Interpretation None       Radiology No results found.  Procedures Procedures (including critical care time)  Medications Ordered in UC Medications - No data to display   Initial Impression / Assessment and Plan / UC Course  I have reviewed the  triage vital signs and the nursing notes.  Pertinent labs & imaging results that were available during my care of the patient were reviewed by me and considered in my medical decision making (see chart for details).     Final Clinical Impressions(s) / UC Diagnoses   Final diagnoses:  Lymphadenopathy, periauricular    New Prescriptions New Prescriptions   DOXYCYCLINE (VIBRA-TABS) 100 MG TABLET    Take 1 tablet (100 mg total) by mouth 2 (two) times daily.     Elvina Sidle, MD 11/20/16 [phone]

## 2016-11-20 NOTE — ED Triage Notes (Signed)
The patient preseented to the [facility] with a complaint of a painful and swollen lymph node on the right side of his neck x 2 days.

## 2017-02-26 ENCOUNTER — Emergency Department (HOSPITAL_COMMUNITY)
Admission: EM | Admit: 2017-02-26 | Discharge: 2017-02-26 | Disposition: A | Discharge status: Home / Self Care | Payer: Self-pay | Attending: Emergency Medicine | Admitting: Emergency Medicine

## 2017-02-26 DIAGNOSIS — E860 Dehydration: Secondary | ICD-10-CM | POA: Insufficient documentation

## 2017-02-26 DIAGNOSIS — R002 Palpitations: Secondary | ICD-10-CM | POA: Insufficient documentation

## 2017-02-26 DIAGNOSIS — Z87891 Personal history of nicotine dependence: Secondary | ICD-10-CM | POA: Insufficient documentation

## 2017-02-26 DIAGNOSIS — I10 Essential (primary) hypertension: Secondary | ICD-10-CM | POA: Insufficient documentation

## 2017-02-26 DIAGNOSIS — R55 Syncope and collapse: Secondary | ICD-10-CM | POA: Insufficient documentation

## 2017-02-26 DIAGNOSIS — Z79899 Other long term (current) drug therapy: Secondary | ICD-10-CM | POA: Insufficient documentation

## 2017-02-26 LAB — CBC
HEMATOCRIT: 43 % (ref 39.0–52.0)
Hemoglobin: 15 g/dL (ref 13.0–17.0)
MCH: 29.9 pg (ref 26.0–34.0)
MCHC: 34.9 g/dL (ref 30.0–36.0)
MCV: 85.7 fL (ref 78.0–100.0)
PLATELETS: 119 10*3/uL — AB (ref 150–400)
RBC: 5.02 MIL/uL (ref 4.22–5.81)
RDW: 13.8 % (ref 11.5–15.5)
WBC: 7.2 10*3/uL (ref 4.0–10.5)

## 2017-02-26 LAB — URINALYSIS, ROUTINE W REFLEX MICROSCOPIC
BACTERIA UA: NONE SEEN
BILIRUBIN URINE: NEGATIVE
Glucose, UA: NEGATIVE mg/dL
KETONES UR: 20 mg/dL — AB
LEUKOCYTES UA: NEGATIVE
Nitrite: NEGATIVE
PH: 5 (ref 5.0–8.0)
Protein, ur: 30 mg/dL — AB
SPECIFIC GRAVITY, URINE: 1.03 (ref 1.005–1.030)
SQUAMOUS EPITHELIAL / LPF: NONE SEEN

## 2017-02-26 LAB — BASIC METABOLIC PANEL
Anion gap: 10 (ref 5–15)
BUN: 15 mg/dL (ref 6–20)
CO2: 25 mmol/L (ref 22–32)
CREATININE: 1.15 mg/dL (ref 0.61–1.24)
Calcium: 9.5 mg/dL (ref 8.9–10.3)
Chloride: 103 mmol/L (ref 101–111)
GFR calc Af Amer: >60 mL/min (ref >60)
GFR calc non Af Amer: >60 mL/min (ref >60)
GLUCOSE: 93 mg/dL (ref 65–99)
POTASSIUM: 3.8 mmol/L (ref 3.5–5.1)
Sodium: 138 mmol/L (ref 135–145)

## 2017-02-26 LAB — TSH: TSH: 1.582 u[IU]/mL (ref 0.350–4.500)

## 2017-02-26 LAB — CBG MONITORING, ED: GLUCOSE-CAPILLARY: 96 mg/dL (ref 65–99)

## 2017-02-26 MED ORDER — METOPROLOL TARTRATE 25 MG PO TABS: 12.5000 mg | ORAL_TABLET | Freq: Two times a day (BID) | ORAL | 0 refills | Status: DC

## 2017-02-26 MED ORDER — SODIUM CHLORIDE 0.9 % IV BOLUS (SEPSIS)
1000.0000 mL | Freq: Once | INTRAVENOUS | Status: AC
  Administered 2017-02-26: 1000 mL via INTRAVENOUS

## 2017-02-26 NOTE — ED Provider Notes (Signed)
[facility] Provider Note   CSN: [id number] Arrival date & time: 02/26/17  1612     History   Chief Complaint Chief Complaint  Patient presents with  . Loss of Consciousness    HPI Brian Anderson is a 31 y.o. male.  Pt presents to the ED today with loc.  The pt said that he has a hx of irregular heart beat.  He felt it go irregular, then woke up on the floor.  He has seen a cardiologist for this problem in the past, but does not have insurance and said he could not afford it.  The pt said he was put on a medication to control it, but it made it worse.  He has not seen a cardiologist in years.  Pt denies any cp or sob.      Past Medical History:  Diagnosis Date  . Anxiety   . Dysrhythmia    pt reports irreg heart beat "PACS" per pt   . GERD (gastroesophageal reflux disease)     There are no active problems to display for this patient.   Past Surgical History:  Procedure Laterality Date  . PERCUTANEOUS PINNING Left 10/08/2013   Procedure: LEFT 5TH FINGER CLOSED REDUCTION PERCUTANEOUS PINNING;  Surgeon: Cheral AlmasNaiping Michael Xu, MD;  Location: [facility];  Service: Orthopedics;  Laterality: Left;  . wisdom teeth  2013       Home Medications    Prior to Admission medications   Medication Sig Start Date End Date Taking? Authorizing Provider  doxycycline (VIBRA-TABS) 100 MG tablet Take 1 tablet (100 mg total) by mouth 2 (two) times daily. 11/20/16   Elvina SidleLauenstein, Kurt, MD  metoprolol tartrate (LOPRESSOR) 25 MG tablet Take 0.5 tablets (12.5 mg total) by mouth 2 (two) times daily. 02/26/17   Jacalyn LefevreHaviland, Ladena Jacquez, MD    Family History Family History  Problem Relation Age of Onset  . Hypertension Mother   . Diabetes Father   . Hypertension Father     Social History Social History  Substance Use Topics  . Smoking status: Former Smoker    Packs/day: 1.00    Years: 4.00    Types: Cigars  . Smokeless tobacco: Never Used  . Alcohol use Yes     Comment: occ     Allergies     Aspirin   Review of Systems Review of Systems  Cardiovascular: Positive for palpitations.  Neurological: Positive for syncope.  All other systems reviewed and are negative.    Physical Exam Updated Vital Signs BP (!) 158/87   Pulse 60   Temp 98.4 F (36.9 C) (Oral)   Resp 14   Ht 5\' 9"  (1.753 m)   Wt 97.5 kg (215 lb)   SpO2 100%   BMI 31.75 kg/m   Physical Exam  Constitutional: He is oriented to person, place, and time. He appears well-developed and well-nourished.  HENT:  Head: Normocephalic and atraumatic.  Right Ear: External ear normal.  Left Ear: External ear normal.  Nose: Nose normal.  Mouth/Throat: Oropharynx is clear and moist.  Eyes: Pupils are equal, round, and reactive to light. Conjunctivae and EOM are normal.  Neck: Normal range of motion. Neck supple.  Cardiovascular: Normal rate, regular rhythm, normal heart sounds and intact distal pulses.   Pulmonary/Chest: Effort normal and breath sounds normal.  Abdominal: Soft. Bowel sounds are normal.  Musculoskeletal: Normal range of motion.  Neurological: He is alert and oriented to person, place, and time.  Skin: Skin is warm.  Psychiatric:  He has a normal mood and affect. His behavior is normal. Judgment and thought content normal.  Nursing note and vitals reviewed.    ED Treatments / Results  Labs (all labs ordered are listed, but only abnormal results are displayed) Labs Reviewed  URINALYSIS, ROUTINE W REFLEX MICROSCOPIC - Abnormal; Notable for the following:       Result Value   Hgb urine dipstick SMALL (*)    Ketones, ur 20 (*)    Protein, ur 30 (*)    All other components within normal limits  BASIC METABOLIC PANEL  CBC  TSH  CBG MONITORING, ED    EKG  EKG Interpretation  Date/Time:  Wednesday February 26 2017 16:27:35 EDT Ventricular Rate:  79 PR Interval:    QRS Duration: 90 QT Interval:  362 QTC Calculation: 415 R Axis:   72 Text Interpretation:  Sinus rhythm Right atrial  enlargement Borderline T wave abnormalities Baseline wander in lead(s) II Confirmed by Jacalyn LefevreHaviland, Nayab Aten [phone]) on 02/26/2017 4:30:44 PM       Radiology No results found.  Procedures Procedures (including critical care time)  Medications Ordered in ED Medications  sodium chloride 0.9 % bolus 1,000 mL (1,000 mLs Intravenous New Bag/Given 02/26/17 1715)     Initial Impression / Assessment and Plan / ED Course  I have reviewed the triage vital signs and the nursing notes.  Pertinent labs & imaging results that were available during my care of the patient were reviewed by me and considered in my medical decision making (see chart for details).    Pt has remained in NSR while here.  He does have elevated blood pressures, so I will start him on a low dose of lopressor to help with bp and help with tachycardia that pt experiences.  He is instructed to establish pcp and f/u with cardiology.  Return if worse.  Final Clinical Impressions(s) / ED Diagnoses   Final diagnoses:  Dehydration  Syncope, unspecified syncope type  Essential hypertension  Palpitations    New Prescriptions New Prescriptions   METOPROLOL TARTRATE (LOPRESSOR) 25 MG TABLET    Take 0.5 tablets (12.5 mg total) by mouth 2 (two) times daily.     Jacalyn LefevreHaviland, Mylene Bow, MD 02/26/17 [phone]

## 2017-02-26 NOTE — ED Triage Notes (Signed)
Pt states he was washing clothes today when he had a syncopal episode. Does not think he hit his head. Hx of dysrhythmia. Alert and oriented.

## 2017-02-27 ENCOUNTER — Encounter: Payer: Self-pay | Admitting: Cardiology

## 2017-02-27 ENCOUNTER — Ambulatory Visit (INDEPENDENT_AMBULATORY_CARE_PROVIDER_SITE_OTHER): Payer: Self-pay | Admitting: Cardiology

## 2017-02-27 DIAGNOSIS — I10 Essential (primary) hypertension: Secondary | ICD-10-CM | POA: Insufficient documentation

## 2017-02-27 DIAGNOSIS — R55 Syncope and collapse: Secondary | ICD-10-CM | POA: Insufficient documentation

## 2017-02-27 DIAGNOSIS — Z720 Tobacco use: Secondary | ICD-10-CM | POA: Insufficient documentation

## 2017-02-27 MED ORDER — HYDROCHLOROTHIAZIDE 25 MG PO TABS: 12.5000 mg | ORAL_TABLET | Freq: Every day | ORAL | 3 refills | Status: DC

## 2017-02-27 NOTE — Progress Notes (Signed)
Cardiology Office Note:    Date:  02/27/2017   ID:  Brian Anderson, DOB May 05, 1986, MRN [id number]  PCP:  Patient, No Pcp Per  Cardiologist:  Garwin Brothersajan R Revankar, MD   Referring MD: No ref. provider found    ASSESSMENT:    1. Syncope, unspecified syncope type   2. Tobacco abuse   3. Essential hypertension    PLAN:    In order of problems listed above:  1. I discussed my findings with the patient at extensive length. His blood pressure is elevated. I asked him to continue taking metoprolol 12.5 mg twice a day which is half a tablet of the prescription for 25 mg tablet he has at this time. He will also be initiated on hydrochlorothiazide 12.5 mg half tablet daily. Potassium supplementation diet was increased. Blood pressure education was given extensively to the patient and he verbalized understanding. He and his fianc had multiple questions which were answered to their satisfaction. Syncopal precautions were advised. 2. Echocardiogram was done to assess murmur heard on auscultation. Patient was advised to refrain from driving until the above aforementioned tests are clear and also to get established with a primary care doctor for noncardiac etiologies for the above. 3. He will have blood work today including thyroid and lipid profile. He will have exercise treadmill stress test to assess for any coronary artery disease and effort tolerance also. 4. He has a significant history of smoking.I spent 5 minutes with the patient discussing solely about smoking. Smoking cessation was counseled. I suggested to the patient also different medications and pharmacological interventions. Patient is keen to try stopping on its own at this time. He will get back to me if he needs any further assistance in this matter. 5. Patient will be seen in follow-up appointment in 1 months or earlier if the patient has any concerns. 6. He knows to go to the nearest emergency room for any significant concerns.  Medication  Adjustments/Labs and Tests Ordered: Current medicines are reviewed at length with the patient today.  Concerns regarding medicines are outlined above.  Orders Placed This Encounter  Procedures  . Exercise Tolerance Test  . ECHOCARDIOGRAM COMPLETE   Meds ordered this encounter  Medications  . hydrochlorothiazide (HYDRODIURIL) 25 MG tablet    Sig: Take 0.5 tablets (12.5 mg total) by mouth daily.    Dispense:  90 tablet    Refill:  3     History of Present Illness:    Brian Anderson is a 31 y.o. male who is being seen today for the evaluation of Essential hypertension and syncope at the [facility]. Patient is a pleasant 31 year old male. He is seen accompanied by his fiance. He mentions to me that he had high blood pressure and thinks he had a spell of passing out. He went to the emergency room and was evaluated and released home on blood pressure medication. He has not taken that medication as is a little worried about taking medications. No chest pain orthopnea or PND. Subsequently or before that his never had such an episode. At the time of my evaluation is alert awake oriented and in no distress. Past Medical History:  Diagnosis Date  . Anxiety   . Dysrhythmia    pt reports irreg heart beat "PACS" per pt   . GERD (gastroesophageal reflux disease)     Past Surgical History:  Procedure Laterality Date  . PERCUTANEOUS PINNING Left 10/08/2013   Procedure:  LEFT 5TH FINGER CLOSED REDUCTION PERCUTANEOUS PINNING;  Surgeon: Cheral AlmasNaiping Michael Xu, MD;  Location: [facility];  Service: Orthopedics;  Laterality: Left;  . wisdom teeth  2013    Current Medications: Current Meds  Medication Sig  . metoprolol tartrate (LOPRESSOR) 25 MG tablet Take 0.5 tablets (12.5 mg total) by mouth 2 (two) times daily.     Allergies:   Aspirin   Social History   Social History  . Marital status: Single    Spouse name: N/A  . Number of children: N/A  . Years of  education: N/A   Social History Main Topics  . Smoking status: Former Smoker    Packs/day: 1.00    Years: 4.00    Types: Cigars  . Smokeless tobacco: Never Used  . Alcohol use Yes     Comment: occ  . Drug use: No     Comment: quite about 5 days ago   . Sexual activity: Yes    Birth control/ protection: None   Other Topics Concern  . None   Social History Narrative  . None     Family History: The patient's family history includes Diabetes in his father; Hypertension in his father and mother.  ROS:   Please see the history of present illness.    All other systems reviewed and are negative.  EKGs/Labs/Other Studies Reviewed:    The following studies were reviewed today: I reviewed emergency room records extensively including electrocardiogram. The records were reviewed and questions were answered to his satisfaction.   Recent Labs: 02/26/2017: BUN 15; Creatinine, Ser 1.15; Hemoglobin 15.0; Platelets 119; Potassium 3.8; Sodium 138; TSH 1.582  Recent Lipid Panel No results found for: CHOL, TRIG, HDL, CHOLHDL, VLDL, LDLCALC, LDLDIRECT  Physical Exam:    VS:  BP (!) 168/88   Pulse 80   Ht 5\' 9"  (1.753 m)   Wt 202 lb 0.6 oz (91.6 kg)   SpO2 99%   BMI 29.84 kg/m     Wt Readings from Last 3 Encounters:  02/27/17 202 lb 0.6 oz (91.6 kg)  02/26/17 215 lb (97.5 kg)  08/15/16 222 lb 1.6 oz (100.7 kg)     GEN: Patient is in no acute distress HEENT: Normal NECK: No JVD; No carotid bruits LYMPHATICS: No lymphadenopathy CARDIAC: S1 S2 regular, 2/6 systolic murmur at the apex. RESPIRATORY:  Clear to auscultation without rales, wheezing or rhonchi  ABDOMEN: Soft, non-tender, non-distended MUSCULOSKELETAL:  No edema; No deformity  SKIN: Warm and dry NEUROLOGIC:  Alert and oriented x 3 PSYCHIATRIC:  Normal affect    Signed, Garwin Brothersajan R Revankar, MD  02/27/2017 3:12 PM     [facility]

## 2017-02-27 NOTE — Patient Instructions (Signed)
Medication Instructions:  Your physician has recommended you make the following change in your medication:  START hydrochlorothiazide 12.5 mg (0.5 tablet) daily   Labwork: None  Testing/Procedures: Your physician has requested that you have an echocardiogram. Echocardiography is a painless test that uses sound waves to create images of your heart. It provides your doctor with information about the size and shape of your heart and how well your heart's chambers and valves are working. This procedure takes approximately one hour. There are no restrictions for this procedure.  Your physician has requested that you have an exercise tolerance test. For further information please visit [email or web address]. Please also follow instruction sheet, as given.    Follow-Up: Your physician recommends that you schedule a follow-up appointment in: 1 month   Any Other Special Instructions Will Be Listed Below (If Applicable).     If you need a refill on your cardiac medications before your next appointment, please call your pharmacy.

## 2017-03-13 ENCOUNTER — Other Ambulatory Visit: Payer: Self-pay

## 2017-03-13 ENCOUNTER — Ambulatory Visit (HOSPITAL_COMMUNITY): Payer: Self-pay | Attending: Cardiology

## 2017-03-13 ENCOUNTER — Ambulatory Visit (INDEPENDENT_AMBULATORY_CARE_PROVIDER_SITE_OTHER): Payer: Self-pay

## 2017-03-13 DIAGNOSIS — I10 Essential (primary) hypertension: Secondary | ICD-10-CM | POA: Insufficient documentation

## 2017-03-13 DIAGNOSIS — R55 Syncope and collapse: Secondary | ICD-10-CM | POA: Insufficient documentation

## 2017-03-13 DIAGNOSIS — Z720 Tobacco use: Secondary | ICD-10-CM | POA: Insufficient documentation

## 2017-03-13 LAB — EXERCISE TOLERANCE TEST
CHL CUP MPHR: 191 {beats}/min
CHL CUP RESTING HR STRESS: 72 {beats}/min
CSEPEDS: 0 s
CSEPEW: 11.6 METS
CSEPPHR: 184 {beats}/min
Exercise duration (min): 10 min
Percent HR: 96 %
RPE: 16

## 2017-04-01 ENCOUNTER — Ambulatory Visit (INDEPENDENT_AMBULATORY_CARE_PROVIDER_SITE_OTHER): Payer: Self-pay | Admitting: Cardiology

## 2017-04-01 ENCOUNTER — Encounter: Payer: Self-pay | Admitting: Cardiology

## 2017-04-01 VITALS — BP 152/96 | HR 58 | Ht 69.0 in | Wt 201.0 lb

## 2017-04-01 DIAGNOSIS — I10 Essential (primary) hypertension: Secondary | ICD-10-CM

## 2017-04-01 DIAGNOSIS — Z720 Tobacco use: Secondary | ICD-10-CM

## 2017-04-01 DIAGNOSIS — R55 Syncope and collapse: Secondary | ICD-10-CM

## 2017-04-01 NOTE — Progress Notes (Signed)
Cardiology Office Note:    Date:  04/01/2017   ID:  Brian Anderson, DOB [birth year], MRN [id number]  PCP:  Patient, No Pcp Per  Cardiologist:  Garwin Brothers, MD   Referring MD: No ref. provider found    ASSESSMENT:    1. Essential hypertension   2. Tobacco abuse   3. Syncope, unspecified syncope type    PLAN:    In order of problems listed above:  1. Patient mentions to me that he is much better now. He denies any dizziness or any such syncopal events. He is very active and is beginning exercise on a regular basis. He will have a Chem-7 today. As he is on a diuretic have asked him to have good potassium supplementation in his diet and educated him about this again smoking cessation was counseled and he will be seen in follow-up appointment in 3 months or earlier if has any concerns.   Medication Adjustments/Labs and Tests Ordered: Current medicines are reviewed at length with the patient today.  Concerns regarding medicines are outlined above.  Orders Placed This Encounter  Procedures  . Basic metabolic panel   No orders of the defined types were placed in this encounter.    Chief Complaint  Patient presents with  . Follow-up    have been doing ok have been able to go back to work, has had headace for a few days and feeling tired. But overall doing well,.     History of Present Illness:    Brian Anderson is a 31 y.o. male he was evaluated by me for his history suggesting of syncope. Patient mentions to me that since then has kept himself well-hydrated and has had no such problems. No chest pain orthopnea or PND he feels the best he has felt in the past several days.  Past Medical History:  Diagnosis Date  . Anxiety   . Dysrhythmia    pt reports irreg heart beat "PACS" per pt   . GERD (gastroesophageal reflux disease)     Past Surgical History:  Procedure Laterality Date  . PERCUTANEOUS PINNING Left 10/08/2013   Procedure: LEFT 5TH FINGER CLOSED REDUCTION  PERCUTANEOUS PINNING;  Surgeon: Cheral Almas, MD;  Location: [facility];  Service: Orthopedics;  Laterality: Left;  . wisdom teeth  2013    Current Medications: Current Meds  Medication Sig  . hydrochlorothiazide (HYDRODIURIL) 25 MG tablet Take 0.5 tablets (12.5 mg total) by mouth daily.  . metoprolol tartrate (LOPRESSOR) 25 MG tablet Take 0.5 tablets (12.5 mg total) by mouth 2 (two) times daily.     Allergies:   Aspirin   Social History   Social History  . Marital status: Single    Spouse name: N/A  . Number of children: N/A  . Years of education: N/A   Social History Main Topics  . Smoking status: Former Smoker    Packs/day: 1.00    Years: 4.00    Types: Cigars  . Smokeless tobacco: Never Used  . Alcohol use Yes     Comment: occ  . Drug use: No     Comment: quite about 5 days ago   . Sexual activity: Yes    Birth control/ protection: None   Other Topics Concern  . None   Social History Narrative  . None     Family History: The patient's family history includes Diabetes in his father; Hypertension in his father and mother.  ROS:   Please see the history of  present illness.    All other systems reviewed and are negative.  EKGs/Labs/Other Studies Reviewed:    The following studies were reviewed today: I discussed my findings with the patient of the stress test at length and questions were answered to his satisfaction.   Recent Labs: 02/26/2017: BUN 15; Creatinine, Ser 1.15; Hemoglobin 15.0; Platelets 119; Potassium 3.8; Sodium 138; TSH 1.582  Recent Lipid Panel No results found for: CHOL, TRIG, HDL, CHOLHDL, VLDL, LDLCALC, LDLDIRECT  Physical Exam:    VS:  BP (!) 152/96   Pulse (!) 58   Ht 5\' 9"  (1.753 m)   Wt 201 lb (91.2 kg)   SpO2 99%   BMI 29.68 kg/m     Wt Readings from Last 3 Encounters:  04/01/17 201 lb (91.2 kg)  02/27/17 202 lb 0.6 oz (91.6 kg)  02/26/17 215 lb (97.5 kg)     GEN: Patient is in no acute distress HEENT: Normal NECK: No  JVD; No carotid bruits LYMPHATICS: No lymphadenopathy CARDIAC: Hear sounds regular, 2/6 systolic murmur at the apex. RESPIRATORY:  Clear to auscultation without rales, wheezing or rhonchi  ABDOMEN: Soft, non-tender, non-distended MUSCULOSKELETAL:  No edema; No deformity  SKIN: Warm and dry NEUROLOGIC:  Alert and oriented x 3 PSYCHIATRIC:  Normal affect   Signed, Garwin Brothersajan R Jailene Cupit, MD  04/01/2017 4:33 PM    [facility]

## 2017-04-01 NOTE — Patient Instructions (Signed)
Medication Instructions:   Your physician recommends that you continue on your current medications as directed. Please refer to the Current Medication list given to you today.   Labwork:  Your physician recommends that you return for lab work in: TODAY (BMP)   Testing/Procedures:  NONE  Follow-Up:  Your physician recommends that you schedule a follow-up appointment in: 3 months with Dr. Tomie Chinaevankar.    Any Other Special Instructions Will Be Listed Below (If Applicable).     If you need a refill on your cardiac medications before your next appointment, please call your pharmacy.

## 2017-04-01 NOTE — Progress Notes (Deleted)
Cardiology Office Note:    Date:  04/01/2017   ID:  Brian Anderson, DOB [birth year], MRN [id number]  PCP:  Patient, No Pcp Per  Cardiologist:  Garwin Brothers, MD   Referring MD: No ref. provider found    ASSESSMENT:    1. Essential hypertension   2. Tobacco abuse    PLAN:    In order of problems listed above:  1. ***   Medication Adjustments/Labs and Tests Ordered: Current medicines are reviewed at length with the patient today.  Concerns regarding medicines are outlined above.  Orders Placed This Encounter  Procedures  . Basic metabolic panel   No orders of the defined types were placed in this encounter.    Chief Complaint  Patient presents with  . Follow-up    have been doing ok have been able to go back to work, has had headace for a few days and feeling tired. But overall doing well,.     History of Present Illness:    Brian Anderson is a 31 y.o. male ***  Past Medical History:  Diagnosis Date  . Anxiety   . Dysrhythmia    pt reports irreg heart beat "PACS" per pt   . GERD (gastroesophageal reflux disease)     Past Surgical History:  Procedure Laterality Date  . PERCUTANEOUS PINNING Left 10/08/2013   Procedure: LEFT 5TH FINGER CLOSED REDUCTION PERCUTANEOUS PINNING;  Surgeon: Cheral Almas, MD;  Location: [facility] OR;  Service: Orthopedics;  Laterality: Left;  . wisdom teeth  2013    Current Medications: Current Meds  Medication Sig  . hydrochlorothiazide (HYDRODIURIL) 25 MG tablet Take 0.5 tablets (12.5 mg total) by mouth daily.  . metoprolol tartrate (LOPRESSOR) 25 MG tablet Take 0.5 tablets (12.5 mg total) by mouth 2 (two) times daily.     Allergies:   Aspirin   Social History   Social History  . Marital status: Single    Spouse name: N/A  . Number of children: N/A  . Years of education: N/A   Social History Main Topics  . Smoking status: Former Smoker    Packs/day: 1.00    Years: 4.00    Types: Cigars  . Smokeless tobacco: Never Used   . Alcohol use Yes     Comment: occ  . Drug use: No     Comment: quite about 5 days ago   . Sexual activity: Yes    Birth control/ protection: None   Other Topics Concern  . None   Social History Narrative  . None     Family History: The patient's family history includes Diabetes in his father; Hypertension in his father and mother.  ROS:   Please see the history of present illness.    All other systems reviewed and are negative.  EKGs/Labs/Other Studies Reviewed:    The following studies were reviewed today: ***   Recent Labs: 02/26/2017: BUN 15; Creatinine, Ser 1.15; Hemoglobin 15.0; Platelets 119; Potassium 3.8; Sodium 138; TSH 1.582  Recent Lipid Panel No results found for: CHOL, TRIG, HDL, CHOLHDL, VLDL, LDLCALC, LDLDIRECT  Physical Exam:    VS:  BP (!) 152/96   Pulse (!) 58   Ht 5\' 9"  (1.753 m)   Wt 201 lb (91.2 kg)   SpO2 99%   BMI 29.68 kg/m     Wt Readings from Last 3 Encounters:  04/01/17 201 lb (91.2 kg)  02/27/17 202 lb 0.6 oz (91.6 kg)  02/26/17 215 lb (97.5 kg)  GEN: Patient is in no acute distress HEENT: Normal NECK: No JVD; No carotid bruits LYMPHATICS: No lymphadenopathy CARDIAC: Hear sounds regular, 2/6 systolic murmur at the apex. RESPIRATORY:  Clear to auscultation without rales, wheezing or rhonchi  ABDOMEN: Soft, non-tender, non-distended MUSCULOSKELETAL:  No edema; No deformity  SKIN: Warm and dry NEUROLOGIC:  Alert and oriented x 3 PSYCHIATRIC:  Normal affect   Signed, Garwin Brothersajan R Revankar, MD  04/01/2017 3:44 PM    [facility]

## 2017-04-02 LAB — BASIC METABOLIC PANEL
BUN/Creatinine Ratio: 9 (ref 9–20)
BUN: 9 mg/dL (ref 6–20)
CALCIUM: 9.6 mg/dL (ref 8.7–10.2)
CO2: 23 mmol/L (ref 20–29)
CREATININE: 1.02 mg/dL (ref 0.76–1.27)
Chloride: 99 mmol/L (ref 96–106)
GFR calc Af Amer: 114 mL/min/{1.73_m2} (ref >59)
GFR, EST NON AFRICAN AMERICAN: 99 mL/min/{1.73_m2} (ref >59)
Glucose: 84 mg/dL (ref 65–99)
POTASSIUM: 4.1 mmol/L (ref 3.5–5.2)
Sodium: 141 mmol/L (ref 134–144)

## 2017-04-04 ENCOUNTER — Other Ambulatory Visit: Payer: Self-pay

## 2017-04-04 MED ORDER — METOPROLOL TARTRATE 25 MG PO TABS: 12.5000 mg | ORAL_TABLET | Freq: Two times a day (BID) | ORAL | 5 refills | Status: DC

## 2017-04-04 MED ORDER — HYDROCHLOROTHIAZIDE 25 MG PO TABS: 12.5000 mg | ORAL_TABLET | Freq: Every day | ORAL | 5 refills | Status: DC

## 2017-07-01 ENCOUNTER — Ambulatory Visit (INDEPENDENT_AMBULATORY_CARE_PROVIDER_SITE_OTHER): Payer: Self-pay | Admitting: Cardiology

## 2017-07-01 ENCOUNTER — Encounter: Payer: Self-pay | Admitting: Cardiology

## 2017-07-01 VITALS — BP 126/74 | HR 76 | Resp 98 | Ht 69.0 in | Wt 245.4 lb

## 2017-07-01 DIAGNOSIS — I10 Essential (primary) hypertension: Secondary | ICD-10-CM

## 2017-07-01 DIAGNOSIS — Z720 Tobacco use: Secondary | ICD-10-CM

## 2017-07-01 MED ORDER — METOPROLOL TARTRATE 25 MG PO TABS: 12.5000 mg | ORAL_TABLET | Freq: Two times a day (BID) | ORAL | 1 refills | Status: AC

## 2017-07-01 MED ORDER — METOPROLOL TARTRATE 25 MG PO TABS
12.5000 mg | ORAL_TABLET | Freq: Two times a day (BID) | ORAL | 1 refills | Status: DC
Start: 2017-07-01 — End: 2017-07-01

## 2017-07-01 MED ORDER — HYDROCHLOROTHIAZIDE 25 MG PO TABS: 12.5000 mg | ORAL_TABLET | Freq: Every day | ORAL | 2 refills | Status: AC

## 2017-07-01 MED ORDER — HYDROCHLOROTHIAZIDE 25 MG PO TABS: 12.5000 mg | ORAL_TABLET | Freq: Every day | ORAL | 2 refills | Status: DC

## 2017-07-01 NOTE — Patient Instructions (Signed)
Medication Instructions:  Your physician recommends that you continue on your current medications as directed. Please refer to the Current Medication list given to you today. Refill for HCTZ and metoprolol was sent  Labwork: None  Testing/Procedures: None  Follow-Up: Your physician recommends that you schedule a follow-up appointment in: 6 months  Any Other Special Instructions Will Be Listed Below (If Applicable).     If you need a refill on your cardiac medications before your next appointment, please call your pharmacy.   [facility]  Garey HamAshley A, RN, BSN

## 2017-07-01 NOTE — Progress Notes (Signed)
Cardiology Office Note:    Date:  07/01/2017   ID:  Brian MoralesBilly D Anderson, DOB [birth year], MRN [id number]  PCP:  Patient, No Pcp Per  Cardiologist:  Garwin Brothersajan R Daelon Dunivan, MD   Referring MD: No ref. provider found    ASSESSMENT:    1. Essential hypertension   2. Tobacco abuse    PLAN:    In order of problems listed above:  1. I discussed extensively with the patient about compliance with diet and medication and exercise.  I encouraged weight loss.  Patient to him that if he continues to gain weight at this rate it will affect his health adversely and he vocalized understanding.  Meticulously from now on.  We will refill his medications.  He was advised to get established with a primary care physician.  He agrees. 2. I spent 5 minutes with the patient discussing solely about smoking. Smoking cessation was counseled. I suggested to the patient also different medications and pharmacological interventions. Patient is keen to try stopping on its own at this time. He will get back to me if he needs any further assistance in this matter.   Medication Adjustments/Labs and Tests Ordered: Current medicines are reviewed at length with the patient today.  Concerns regarding medicines are outlined above.  No orders of the defined types were placed in this encounter.  Meds ordered this encounter  Medications  . hydrochlorothiazide (HYDRODIURIL) 25 MG tablet    Sig: Take 0.5 tablets (12.5 mg total) by mouth daily.    Dispense:  30 tablet    Refill:  2  . metoprolol tartrate (LOPRESSOR) 25 MG tablet    Sig: Take 0.5 tablets (12.5 mg total) by mouth 2 (two) times daily.    Dispense:  120 tablet    Refill:  1     Chief Complaint  Patient presents with  . Follow-up     History of Present Illness:    Brian Anderson is a 31 y.o. male.  Patient has history of hypertension.  Mentions to me that he has quit smoking for about 70 days and then went back to smoking.  Also mentions to me that he has been very  lax with his diet.  He has gained a significant amount of weight since his last evaluation.  He has also been noncompliant with his medications and takes them on and off.  Past Medical History:  Diagnosis Date  . Anxiety   . Dysrhythmia    pt reports irreg heart beat "PACS" per pt   . GERD (gastroesophageal reflux disease)     Past Surgical History:  Procedure Laterality Date  . PERCUTANEOUS PINNING Left 10/08/2013   Procedure: LEFT 5TH FINGER CLOSED REDUCTION PERCUTANEOUS PINNING;  Surgeon: Cheral AlmasNaiping Michael Xu, MD;  Location: [facility] OR;  Service: Orthopedics;  Laterality: Left;  . wisdom teeth  2013    Current Medications: Current Meds  Medication Sig  . hydrochlorothiazide (HYDRODIURIL) 25 MG tablet Take 0.5 tablets (12.5 mg total) by mouth daily.  . metoprolol tartrate (LOPRESSOR) 25 MG tablet Take 0.5 tablets (12.5 mg total) by mouth 2 (two) times daily.  . [DISCONTINUED] hydrochlorothiazide (HYDRODIURIL) 25 MG tablet Take 0.5 tablets (12.5 mg total) by mouth daily.  . [DISCONTINUED] metoprolol tartrate (LOPRESSOR) 25 MG tablet Take 0.5 tablets (12.5 mg total) by mouth 2 (two) times daily.     Allergies:   Aspirin   Social History   Socioeconomic History  . Marital status: Single    Spouse name: None  .  Number of children: None  . Years of education: None  . Highest education level: None  Social Needs  . Financial resource strain: None  . Food insecurity - worry: None  . Food insecurity - inability: None  . Transportation needs - medical: None  . Transportation needs - non-medical: None  Occupational History  . None  Tobacco Use  . Smoking status: Former Smoker    Packs/day: 1.00    Years: 4.00    Pack years: 4.00    Types: Cigars  . Smokeless tobacco: Never Used  Substance and Sexual Activity  . Alcohol use: Yes    Comment: occ  . Drug use: No    Comment: quite about 5 days ago   . Sexual activity: Yes    Birth control/protection: None  Other Topics Concern    . None  Social History Narrative  . None     Family History: The patient's family history includes Diabetes in his father; Hypertension in his father and mother.  ROS:   Please see the history of present illness.    All other systems reviewed and are negative.  EKGs/Labs/Other Studies Reviewed:    The following studies were reviewed today: Discussed findings of the stress test and he vocalized understanding.   Recent Labs: 02/26/2017: Hemoglobin 15.0; Platelets 119; TSH 1.582 04/01/2017: BUN 9; Creatinine, Ser 1.02; Potassium 4.1; Sodium 141  Recent Lipid Panel No results found for: CHOL, TRIG, HDL, CHOLHDL, VLDL, LDLCALC, LDLDIRECT  Physical Exam:    VS:  BP 126/74   Pulse 76   Resp (!) 98   Ht 5\' 9"  (1.753 m)   Wt 245 lb 6.4 oz (111.3 kg)   BMI 36.24 kg/m     Wt Readings from Last 3 Encounters:  07/01/17 245 lb 6.4 oz (111.3 kg)  04/01/17 201 lb (91.2 kg)  02/27/17 202 lb 0.6 oz (91.6 kg)     GEN: Patient is in no acute distress HEENT: Normal NECK: No JVD; No carotid bruits LYMPHATICS: No lymphadenopathy CARDIAC: Hear sounds regular, 2/6 systolic murmur at the apex. RESPIRATORY:  Clear to auscultation without rales, wheezing or rhonchi  ABDOMEN: Soft, non-tender, non-distended MUSCULOSKELETAL:  No edema; No deformity  SKIN: Warm and dry NEUROLOGIC:  Alert and oriented x 3 PSYCHIATRIC:  Normal affect   Signed, Garwin Brothersajan R Liliya Fullenwider, MD  07/01/2017 4:09 PM    [facility]

## 2018-09-08 ENCOUNTER — Emergency Department (HOSPITAL_COMMUNITY)
Admission: EM | Admit: 2018-09-08 | Discharge: 2018-09-08 | Disposition: A | Discharge status: Home / Self Care | Payer: Self-pay | Attending: Emergency Medicine | Admitting: Emergency Medicine

## 2018-09-08 ENCOUNTER — Other Ambulatory Visit: Payer: Self-pay

## 2018-09-08 DIAGNOSIS — Y939 Activity, unspecified: Secondary | ICD-10-CM | POA: Insufficient documentation

## 2018-09-08 DIAGNOSIS — Z87891 Personal history of nicotine dependence: Secondary | ICD-10-CM | POA: Insufficient documentation

## 2018-09-08 DIAGNOSIS — S0083XA Contusion of other part of head, initial encounter: Secondary | ICD-10-CM | POA: Insufficient documentation

## 2018-09-08 DIAGNOSIS — Y999 Unspecified external cause status: Secondary | ICD-10-CM | POA: Insufficient documentation

## 2018-09-08 DIAGNOSIS — S161XXA Strain of muscle, fascia and tendon at neck level, initial encounter: Secondary | ICD-10-CM | POA: Insufficient documentation

## 2018-09-08 DIAGNOSIS — Z79899 Other long term (current) drug therapy: Secondary | ICD-10-CM | POA: Insufficient documentation

## 2018-09-08 DIAGNOSIS — Y929 Unspecified place or not applicable: Secondary | ICD-10-CM | POA: Insufficient documentation

## 2018-09-08 MED ORDER — CYCLOBENZAPRINE HCL 10 MG PO TABS: 10.0000 mg | ORAL_TABLET | Freq: Two times a day (BID) | ORAL | 0 refills | Status: AC | PRN

## 2018-09-08 MED ORDER — NAPROXEN 375 MG PO TABS: 375.0000 mg | ORAL_TABLET | Freq: Two times a day (BID) | ORAL | 0 refills | Status: AC
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
## 2018-09-08 NOTE — ED Notes (Signed)
Pt reports hx of HTN, states he is not taking medication

## 2018-09-08 NOTE — Discharge Instructions (Addendum)
Do not drive while taking the muscle relaxer as it will make you sleepy. Follow up with your doctor or return here as needed for worsening symptoms.
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
## 2020-02-04 ENCOUNTER — Other Ambulatory Visit: Payer: Self-pay

## 2020-02-04 ENCOUNTER — Ambulatory Visit: Payer: Self-pay | Admitting: Physician Assistant

## 2020-02-04 DIAGNOSIS — Z202 Contact with and (suspected) exposure to infections with a predominantly sexual mode of transmission: Secondary | ICD-10-CM

## 2020-02-04 DIAGNOSIS — Z113 Encounter for screening for infections with a predominantly sexual mode of transmission: Secondary | ICD-10-CM

## 2020-02-04 LAB — GRAM STAIN

## 2020-02-04 MED ORDER — AZITHROMYCIN 500 MG PO TABS
1000.0000 mg | ORAL_TABLET | Freq: Once | ORAL | Status: AC
  Administered 2020-02-04: 1000 mg via ORAL

## 2020-02-04 NOTE — Progress Notes (Signed)
Gram stain reviewed and is negative today, so no treatment needed for gram stain per standing order. Pt treated as a contact to Chlamydia per provider order and standing order. Awaiting TR's. Provider orders completed.

## 2020-02-05 ENCOUNTER — Encounter: Payer: Self-pay | Admitting: Physician Assistant

## 2020-02-05 NOTE — Progress Notes (Signed)
[facility] Department STI clinic/screening visit  Subjective:  Brian Anderson is a 34 y.o. male being seen today for an STI screening visit. The patient reports they do not have symptoms.    Patient has the following medical conditions:   Patient Active Problem List   Diagnosis Date Noted  . Tobacco abuse 02/27/2017  . Essential hypertension 02/27/2017     Chief Complaint  Patient presents with  . SEXUALLY TRANSMITTED DISEASE    screening    HPI  Patient reports that he is not having any symptoms but is a contact to Chlamydia.  States that he takes 2 medicines for HTN and has had a surgery on his pinkie.  Reports last HIV test was 08/2019 and last void prior to sample collection was about 1 hr ago.   See flowsheet for further details and programmatic requirements.    The following portions of the patient's history were reviewed and updated as appropriate: allergies, current medications, past medical history, past social history, past surgical history and problem list.  Objective:  There were no vitals filed for this visit.  Physical Exam Constitutional:      General: He is not in acute distress.    Appearance: Normal appearance.  HENT:     Head: Normocephalic and atraumatic.     Comments: No nits, lice, or hair loss. No cervical, supraclavicular or axillary adenopathy.    Mouth/Throat:     Mouth: Mucous membranes are moist.     Pharynx: Oropharynx is clear. No oropharyngeal exudate or posterior oropharyngeal erythema.  Eyes:     Conjunctiva/sclera: Conjunctivae normal.  Pulmonary:     Effort: Pulmonary effort is normal.  Abdominal:     Palpations: Abdomen is soft. There is no mass.     Tenderness: There is no abdominal tenderness. There is no guarding or rebound.  Genitourinary:    Penis: Normal.      Testes: Normal.     Comments: Pubic area without nits, lice, edema, erythema, lesions and inguinal adenopathy. Penis circumcised, without rash,  lesions and discharge from meatus. Musculoskeletal:     Cervical back: Neck supple. No tenderness.  Skin:    General: Skin is warm and dry.     Findings: No bruising, erythema, lesion or rash.  Neurological:     Mental Status: He is alert and oriented to person, place, and time.  Psychiatric:        Mood and Affect: Mood normal.        Behavior: Behavior normal.        Thought Content: Thought content normal.        Judgment: Judgment normal.       Assessment and Plan:  Brian Anderson is a 34 y.o. male presenting to the [facility] Department for STI screening  1. Screening for STD (sexually transmitted disease) Patient into clinic without symptoms. Rec condoms with all sex. Await test results.  Counseled that RN will call if needs to RTC for treatment once results are back. - Gram stain - Gonococcus culture - HIV [facility] - Syphilis Serology, [facility] - Gonococcus culture  2. Chlamydia contact Treat as a contact to Chlamydia with Azithromycin 1g po DOT today. No sex for 7 days and until after partner completes treatment. RTC for re-treatment if vomits < 2 hr after taking medicine.  - azithromycin (ZITHROMAX) tablet 1,000 mg     Return for 2-3 weeks for TR's, and PRN.  No  future appointments.  Matt Holmes, PA

## 2020-02-09 LAB — GONOCOCCUS CULTURE
